# Patient Record
Sex: Male | Born: 1970 | Race: White | Hispanic: No | Marital: Married | Smoking: Current every day smoker
Health system: Southern US, Community
[De-identification: ages and names within clinical notes are randomized; demographics above are authoritative.]

---

## 2003-11-14 ENCOUNTER — Other Ambulatory Visit: Payer: Self-pay

## 2004-04-15 ENCOUNTER — Emergency Department: Payer: Self-pay | Admitting: Unknown Physician Specialty

## 2015-07-20 ENCOUNTER — Emergency Department: Payer: BLUE CROSS/BLUE SHIELD

## 2015-07-20 ENCOUNTER — Encounter: Payer: Self-pay | Admitting: *Deleted

## 2015-07-20 ENCOUNTER — Emergency Department
Admission: EM | Admit: 2015-07-20 | Discharge: 2015-07-20 | Disposition: A | Discharge status: Home / Self Care | Payer: BLUE CROSS/BLUE SHIELD | Attending: Emergency Medicine | Admitting: Emergency Medicine

## 2015-07-20 DIAGNOSIS — R531 Weakness: Secondary | ICD-10-CM | POA: Insufficient documentation

## 2015-07-20 DIAGNOSIS — F17210 Nicotine dependence, cigarettes, uncomplicated: Secondary | ICD-10-CM | POA: Diagnosis not present

## 2015-07-20 DIAGNOSIS — R42 Dizziness and giddiness: Secondary | ICD-10-CM

## 2015-07-20 DIAGNOSIS — R202 Paresthesia of skin: Secondary | ICD-10-CM | POA: Insufficient documentation

## 2015-07-20 LAB — CBC
HEMATOCRIT: 48.7 % (ref 40.0–52.0)
Hemoglobin: 16.4 g/dL (ref 13.0–18.0)
MCH: 29.6 pg (ref 26.0–34.0)
MCHC: 33.7 g/dL (ref 32.0–36.0)
MCV: 87.7 fL (ref 80.0–100.0)
Platelets: 269 10*3/uL (ref 150–440)
RBC: 5.55 MIL/uL (ref 4.40–5.90)
RDW: 13.4 % (ref 11.5–14.5)
WBC: 9.4 10*3/uL (ref 3.8–10.6)

## 2015-07-20 LAB — BASIC METABOLIC PANEL
ANION GAP: 6 (ref 5–15)
BUN: 12 mg/dL (ref 6–20)
CO2: 29 mmol/L (ref 22–32)
Calcium: 9.1 mg/dL (ref 8.9–10.3)
Chloride: 104 mmol/L (ref 101–111)
Creatinine, Ser: 1.09 mg/dL (ref 0.61–1.24)
GFR calc Af Amer: >60 mL/min (ref >60)
Glucose, Bld: 121 mg/dL — ABNORMAL HIGH (ref 65–99)
POTASSIUM: 3.8 mmol/L (ref 3.5–5.1)
SODIUM: 139 mmol/L (ref 135–145)

## 2015-07-20 LAB — TROPONIN I

## 2015-07-20 MED ORDER — ZOLPIDEM TARTRATE 10 MG PO TABS: 10.0000 mg | ORAL_TABLET | Freq: Every evening | ORAL | Status: DC | PRN

## 2015-07-20 NOTE — Discharge Instructions (Signed)

## 2015-07-20 NOTE — ED Notes (Signed)
Lab notified of add on troponin.

## 2015-07-20 NOTE — ED Notes (Signed)
Pt reports dizziness for 1 week with intermittent tingling of left side

## 2015-07-20 NOTE — ED Notes (Signed)
Patient taken to CT.

## 2015-07-20 NOTE — ED Provider Notes (Signed)
[facility] Emergency Department Provider Note     Time seen: ----------------------------------------- 6:25 PM on 07/20/2015 -----------------------------------------    I have reviewed the triage vital signs and the nursing notes.   HISTORY  Chief Complaint Dizziness    HPI Tanner Berg is a 45 y.o. male who presents ER for dizziness for the last week with intermittent tingling. Patient denies fevers or chills, states he feels lightheaded when he changes positions or when he stands up suddenly. Patient notes he's been under a lot of stress, he rarely sleeps, he works 2 jobs and drinks a pot of coffee in the morning.   History reviewed. No pertinent past medical history.  There are no active problems to display for this patient.   History reviewed. No pertinent past surgical history.  Allergies Review of patient's allergies indicates no known allergies.  Social History Social History  Substance Use Topics  . Smoking status: Current Every Day Smoker -- 1.00 packs/day    Types: Cigarettes  . Smokeless tobacco: None  . Alcohol Use: No    Review of Systems Constitutional: Negative for fever. Eyes: Negative for visual changes. ENT: Negative for sore throat. Cardiovascular: Negative for chest pain. Respiratory: Negative for shortness of breath. Gastrointestinal: Negative for abdominal pain, vomiting and diarrhea. Genitourinary: Negative for dysuria. Musculoskeletal: Negative for back pain. Skin: Negative for rash. Neurological: Negative for headaches, positive for weakness and dizziness  10-point ROS otherwise negative.  ____________________________________________   PHYSICAL EXAM:  VITAL SIGNS: ED Triage Vitals  Enc Vitals Group     BP 07/20/15 1642 148/77 mmHg     Pulse Rate 07/20/15 1642 78     Resp 07/20/15 1642 20     Temp 07/20/15 1642 97.9 F (36.6 C)     Temp src --      SpO2 07/20/15 1642 98 %     Weight 07/20/15 1642  200 lb (90.719 kg)     Height 07/20/15 1642  (1.803 m)     Head Cir --      Peak Flow --      Pain Score --      Pain Loc --      Pain Edu? --      Excl. in GC? --     Constitutional: Alert and oriented. Well appearing and in no distress. Eyes: Conjunctivae are normal. PERRL. Normal extraocular movements. ENT   Head: Normocephalic and atraumatic.   Nose: No congestion/rhinnorhea.   Mouth/Throat: Mucous membranes are moist.   Neck: No stridor. Cardiovascular: Normal rate, regular rhythm. Normal and symmetric distal pulses are present in all extremities. No murmurs, rubs, or gallops. Respiratory: Normal respiratory effort without tachypnea nor retractions. Breath sounds are clear and equal bilaterally. No wheezes/rales/rhonchi. Gastrointestinal: Soft and nontender. No distention. No abdominal bruits.  Musculoskeletal: Nontender with normal range of motion in all extremities. No joint effusions.  No lower extremity tenderness nor edema. Neurologic:  Normal speech and language. No gross focal neurologic deficits are appreciated. Speech is normal. No gait instability. Skin:  Skin is warm, dry and intact. No rash noted. Psychiatric: Mood and affect are normal. Speech and behavior are normal. Patient exhibits appropriate insight and judgment. ____________________________________________  EKG: Interpreted by me. Normal sinus rhythm rate of 78 bpm, normal PR interval, normal QRS, normal QT interval.  ____________________________________________  ED COURSE:  Pertinent labs & imaging results that were available during my care of the patient were reviewed by me and considered in my medical decision  making (see chart for details). Patient is in no acute distress, I will check basic labs and reevaluate. We will check orthostatics. ____________________________________________    LABS (pertinent positives/negatives)  Labs Reviewed  BASIC METABOLIC PANEL - Abnormal; Notable  for the following:    Glucose, Bld 121 (*)    All other components within normal limits  CBC  TROPONIN I  URINALYSIS COMPLETEWITH MICROSCOPIC ([facility] ONLY)    RADIOLOGY  CT head was unremarkable  ____________________________________________  FINAL ASSESSMENT AND PLAN  Dizziness  Plan: Patient with labs and imaging as dictated above. Patient symptoms are likely multifactorial including stress, sleep deprivation, and heavy caffeine and stimulant intake. I advised him to cut back on the caffeine, I'll prescribe Ambien for sleep and he has follow-up with his doctor on Monday.   Emily Filbert, MD   Emily Filbert, MD 07/20/15 [phone]

## 2015-07-20 NOTE — ED Notes (Signed)
Pt states he is unable to urinate at this time.

## 2016-05-16 ENCOUNTER — Emergency Department
Admission: EM | Admit: 2016-05-16 | Discharge: 2016-05-16 | Disposition: A | Discharge status: Home / Self Care | Payer: 59 | Attending: Emergency Medicine | Admitting: Emergency Medicine

## 2016-05-16 DIAGNOSIS — Y9389 Activity, other specified: Secondary | ICD-10-CM | POA: Insufficient documentation

## 2016-05-16 DIAGNOSIS — Y999 Unspecified external cause status: Secondary | ICD-10-CM | POA: Diagnosis not present

## 2016-05-16 DIAGNOSIS — S3093XA Unspecified superficial injury of penis, initial encounter: Secondary | ICD-10-CM | POA: Diagnosis present

## 2016-05-16 DIAGNOSIS — S301XXA Contusion of abdominal wall, initial encounter: Secondary | ICD-10-CM

## 2016-05-16 DIAGNOSIS — Z23 Encounter for immunization: Secondary | ICD-10-CM | POA: Insufficient documentation

## 2016-05-16 DIAGNOSIS — N4889 Other specified disorders of penis: Secondary | ICD-10-CM

## 2016-05-16 DIAGNOSIS — Y929 Unspecified place or not applicable: Secondary | ICD-10-CM | POA: Insufficient documentation

## 2016-05-16 DIAGNOSIS — W228XXA Striking against or struck by other objects, initial encounter: Secondary | ICD-10-CM | POA: Diagnosis not present

## 2016-05-16 DIAGNOSIS — S3021XA Contusion of penis, initial encounter: Secondary | ICD-10-CM | POA: Insufficient documentation

## 2016-05-16 DIAGNOSIS — F17210 Nicotine dependence, cigarettes, uncomplicated: Secondary | ICD-10-CM | POA: Insufficient documentation

## 2016-05-16 MED ORDER — TETANUS-DIPHTH-ACELL PERTUSSIS 5-2.5-18.5 LF-MCG/0.5 IM SUSP
0.5000 mL | Freq: Once | INTRAMUSCULAR | Status: AC
  Administered 2016-05-16: 0.5 mL via INTRAMUSCULAR
  Filled 2016-05-16: qty 0.5

## 2016-05-16 MED ORDER — CEPHALEXIN 500 MG PO CAPS: 500.0000 mg | ORAL_CAPSULE | Freq: Four times a day (QID) | ORAL | 0 refills | Status: DC

## 2016-05-16 MED ORDER — CEPHALEXIN 500 MG PO CAPS
500.0000 mg | ORAL_CAPSULE | Freq: Once | ORAL | Status: AC
  Administered 2016-05-16: 500 mg via ORAL
  Filled 2016-05-16: qty 1

## 2016-05-16 NOTE — ED Triage Notes (Signed)
Pt reports Tuesday he was pulling a tree limb and it came off and hit him in the groin. Pt c/o sweeling and redness to foreskin. Pt denies difficulty urinating, dysuria or hematuria.

## 2016-05-16 NOTE — Discharge Instructions (Signed)
Follow-up with urology. Take antibiotic as prescribed.  Get help right away if: You have a fever or chills. Pain or swelling or redness in the buttock or scrotum You have increased pain, swelling, or redness in your genitals or your groin area. Other new concerns arise

## 2016-05-16 NOTE — ED Provider Notes (Signed)
[facility] Emergency Department Provider Note   ____________________________________________   First MD Initiated Contact with Patient 05/16/16 [phone]     (approximate)  I have reviewed the triage vital signs and the nursing notes.   HISTORY  Chief Complaint Groin Pain   HPI Tanner Berg is a 45 y.o. male reports no significant medical history.  On Tuesday he was cutting down a tree, while pulling on a limits snapped and struck him across the groin. He reports that he had discomfort and slight swelling at the left base of the penis, it swelled up slightly over the last day and a half and he treated himself with a 12-gauge sterile needle and reports she is able with throbbing back several cc of blood. Swelling did not come back in that area, and he has not noticed any fevers chills or drainage but he has noticed a slight amount of swelling throughout the foreskin since yesterday. He is able to withdraw the foreskin and advance it without difficulty, denies it is painful, denies any discomfort in the penis or any ongoing or painful erections. He has not had any swelling or redness in the scrotum. He reports foreskin seems just slightly to moderately swollen throughout.  There was no bleeding or puncture of the skin when struck by the limb, but there was some slight swelling at the base and "bruising"   No past medical history on file.  There are no active problems to display for this patient.   No past surgical history on file.  Prior to Admission medications   Medication Sig Start Date End Date Taking? Authorizing Provider  cephALEXin (KEFLEX) 500 MG capsule Take 1 capsule (500 mg total) by mouth 4 (four) times daily. 05/16/16   Sharyn CreamerMark Yusef Lamp, MD  zolpidem (AMBIEN) 10 MG tablet Take 1 tablet (10 mg total) by mouth at bedtime as needed for sleep. 07/20/15   Emily FilbertJonathan E Williams, MD    Allergies Patient has no known allergies.  No family history on  file.  Social History Social History  Substance Use Topics  . Smoking status: Current Every Day Smoker    Packs/day: 1.00    Types: Cigarettes  . Smokeless tobacco: Not on file  . Alcohol use No    Review of Systems Constitutional: No fever/chills Eyes: No visual changes. ENT: No sore throat. Cardiovascular: Denies chest pain. Respiratory: Denies shortness of breath. Gastrointestinal: No abdominal pain.  No nausea, no vomiting.  No diarrhea.  No constipation. Genitourinary: Negative for dysuria. No drainage or pus Musculoskeletal: Negative for back pain. Skin: Negative for rash. Neurological: Negative for headaches, focal weakness or numbness.  10-point ROS otherwise negative.  ____________________________________________   PHYSICAL EXAM:  VITAL SIGNS: ED Triage Vitals  Enc Vitals Group     BP 05/16/16 0853 (!) 156/78     Pulse Rate 05/16/16 0853 (!) 118     Resp 05/16/16 0853 20     Temp 05/16/16 0853 98.6 F (37 C)     Temp Source 05/16/16 0853 Oral     SpO2 05/16/16 0853 100 %     Weight 05/16/16 0854 188 lb (85.3 kg)     Height 05/16/16 0854 5\' 11"  (1.803 m)     Head Circumference --      Peak Flow --      Pain Score 05/16/16 0854 3     Pain Loc --      Pain Edu? --      Excl. in GC? --  Constitutional: Alert and oriented. Well appearing and in no acute distress. Eyes: Conjunctivae are normal. PERRL. EOMI. Head: Atraumatic. Nose: No congestion/rhinnorhea. Mouth/Throat: Mucous membranes are moist.  Oropharynx non-erythematous. Neck: No stridor.   Cardiovascular: Normal rate, regular rhythm.  Respiratory: Normal respiratory effort.  Gastrointestinal: Soft and nontender.  Genitourinary: No groin pain or mass. The penis is flaccid, uncircumcised with a slight amount of global edema involving the foreskin, which is easily retractable and advance supple. The glands penis appears normal. At the superior aspect of the shaft, there is a small area of firmness  where the patient reports he drained blood from 2 days ago. There is no tenderness to the penis. No purulence or drainage. There is a slight amount of erythema involving the foreskin globally, but no focal points area of the scrotum is normal nontender with nontender testicles, no scrotal edema or redness. No redness along the perineum. Musculoskeletal: No lower extremity tenderness nor edema. Neurologic:  Normal speech and language. No gross focal neurologic deficits are appreciated. No gait instability. Skin:  Skin is warm, dry and intact. No rash noted. Psychiatric: Mood and affect are normal. Speech and behavior are normal.  ____________________________________________   LABS (all labs ordered are listed, but only abnormal results are displayed)  Labs Reviewed - No data to display ____________________________________________  EKG   ____________________________________________  RADIOLOGY   ____________________________________________   PROCEDURES  Procedure(s) performed: None  Procedures  Critical Care performed: No  ____________________________________________   INITIAL IMPRESSION / ASSESSMENT AND PLAN / ED COURSE  Pertinent labs & imaging results that were available during my care of the patient were reviewed by me and considered in my medical decision making (see chart for details).  Patient noted to have had a hematoma over the ventral base of the penis which she has drained, appears this is improving but has not developed mild edema with minimal erythema of the foreskin. No evidence of extension into the scrotum or gland/shaft of the penis itself. Discussed with Dr. Cleda MccreedyPatrick Mackenzie of urology, advises treatment with Keflex and close follow-up. Discussed with the patient careful return precautions and treatment recommendations. Patient will monitor for improvement, return to the ER if any concerns which I personally discussed and provided him and discharge  instructions  Clinical Course      ____________________________________________   FINAL CLINICAL IMPRESSION(S) / ED DIAGNOSES  Final diagnoses:  Hematoma of groin, initial encounter  Foreskin swelling      NEW MEDICATIONS STARTED DURING THIS VISIT:  New Prescriptions   CEPHALEXIN (KEFLEX) 500 MG CAPSULE    Take 1 capsule (500 mg total) by mouth 4 (four) times daily.     Note:  This document was prepared using Dragon voice recognition software and may include unintentional dictation errors.     Sharyn CreamerMark Kavion Mancinas, MD 05/16/16 [phone]

## 2017-10-05 ENCOUNTER — Encounter: Payer: Self-pay | Admitting: Emergency Medicine

## 2017-10-05 ENCOUNTER — Emergency Department
Admission: EM | Admit: 2017-10-05 | Discharge: 2017-10-06 | Disposition: A | Discharge status: Home / Self Care | Payer: BLUE CROSS/BLUE SHIELD | Attending: Emergency Medicine | Admitting: Emergency Medicine

## 2017-10-05 ENCOUNTER — Other Ambulatory Visit: Payer: Self-pay

## 2017-10-05 ENCOUNTER — Emergency Department: Payer: BLUE CROSS/BLUE SHIELD

## 2017-10-05 DIAGNOSIS — F17210 Nicotine dependence, cigarettes, uncomplicated: Secondary | ICD-10-CM | POA: Insufficient documentation

## 2017-10-05 DIAGNOSIS — R42 Dizziness and giddiness: Secondary | ICD-10-CM | POA: Diagnosis not present

## 2017-10-05 DIAGNOSIS — M542 Cervicalgia: Secondary | ICD-10-CM

## 2017-10-05 DIAGNOSIS — M4802 Spinal stenosis, cervical region: Secondary | ICD-10-CM | POA: Diagnosis not present

## 2017-10-05 DIAGNOSIS — M9981 Other biomechanical lesions of cervical region: Secondary | ICD-10-CM

## 2017-10-05 LAB — CBC
HCT: 47.2 % (ref 40.0–52.0)
Hemoglobin: 16.3 g/dL (ref 13.0–18.0)
MCH: 31 pg (ref 26.0–34.0)
MCHC: 34.5 g/dL (ref 32.0–36.0)
MCV: 89.9 fL (ref 80.0–100.0)
PLATELETS: 276 10*3/uL (ref 150–440)
RBC: 5.25 MIL/uL (ref 4.40–5.90)
RDW: 13.4 % (ref 11.5–14.5)
WBC: 10.9 10*3/uL — AB (ref 3.8–10.6)

## 2017-10-05 LAB — URINALYSIS, COMPLETE (UACMP) WITH MICROSCOPIC
Bacteria, UA: NONE SEEN
Bilirubin Urine: NEGATIVE
GLUCOSE, UA: NEGATIVE mg/dL
HGB URINE DIPSTICK: NEGATIVE
KETONES UR: NEGATIVE mg/dL
LEUKOCYTES UA: NEGATIVE
NITRITE: NEGATIVE
PH: 7 (ref 5.0–8.0)
PROTEIN: NEGATIVE mg/dL
Specific Gravity, Urine: 1.005 (ref 1.005–1.030)
Squamous Epithelial / LPF: NONE SEEN

## 2017-10-05 LAB — GLUCOSE, CAPILLARY: Glucose-Capillary: 148 mg/dL — ABNORMAL HIGH (ref 65–99)

## 2017-10-05 LAB — BASIC METABOLIC PANEL
Anion gap: 7 (ref 5–15)
BUN: 13 mg/dL (ref 6–20)
CALCIUM: 9 mg/dL (ref 8.9–10.3)
CHLORIDE: 104 mmol/L (ref 101–111)
CO2: 27 mmol/L (ref 22–32)
CREATININE: 1.04 mg/dL (ref 0.61–1.24)
Glucose, Bld: 146 mg/dL — ABNORMAL HIGH (ref 65–99)
Potassium: 3.9 mmol/L (ref 3.5–5.1)
SODIUM: 138 mmol/L (ref 135–145)

## 2017-10-05 LAB — TROPONIN I: Troponin I: <0.03 ng/mL (ref <0.03)

## 2017-10-05 IMAGING — CT CT ANGIO NECK
2 of 11 series · 7 of 33 positions shown · IV contrast (APPLIED)
Comparison: CT HEAD [DATE]

CLINICAL DATA: Intermittent dizziness, nausea and head numbness.
Assess episodic vertigo. Smoker.

EXAM:
CT ANGIOGRAPHY HEAD AND NECK
TECHNIQUE: Multidetector CT imaging of the head and neck was performed using
the standard protocol during bolus administration of intravenous
contrast. Multiplanar CT image reconstructions and MIPs were
obtained to evaluate the vascular anatomy. Carotid stenosis
measurements (when applicable) are obtained utilizing NASCET
criteria, using the distal internal carotid diameter as the
denominator.
CONTRAST:  75mL OMNIPAQUE IOHEXOL 350 MG/ML SOLN

[Series 10: cta head neck · axial · 0.54mm/px · z∈[+790,+920]mm · 2 of 197 slices shown]
[im 66/197  soft-tissue]
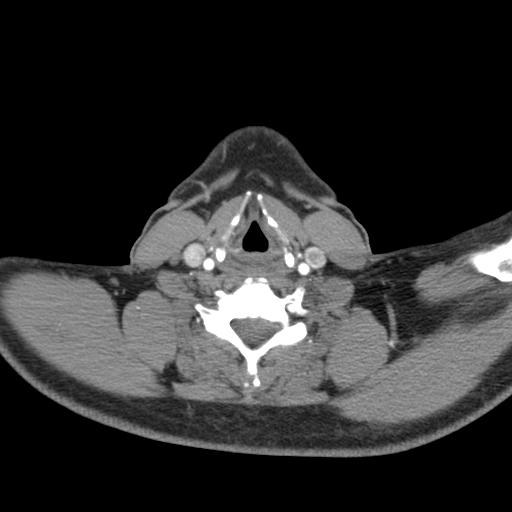
[im 131/197  soft-tissue]
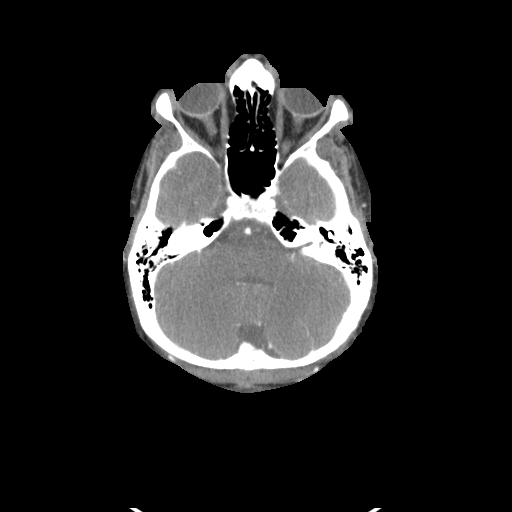

[Series 12: ax thin · axial · 0.43mm/px · z∈[+723,+985]mm · 5 of 394 slices shown]
[im 66/394  soft-tissue]
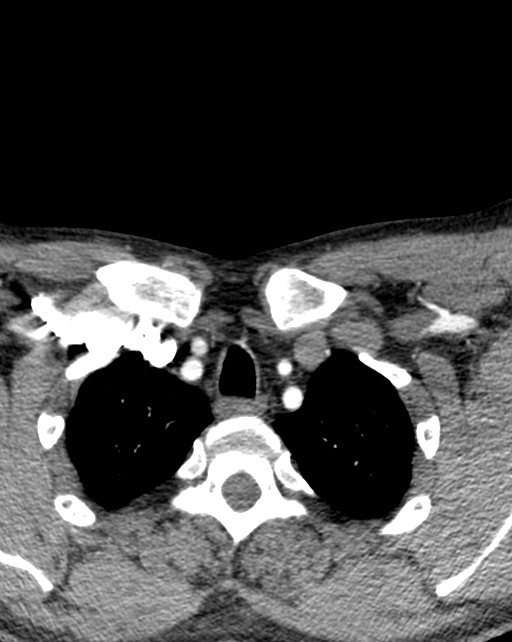
[im 132/394  bone]
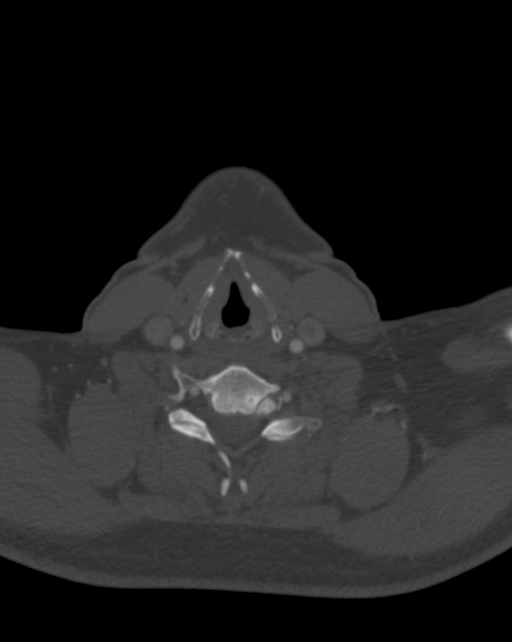
[im 197/394  soft-tissue]
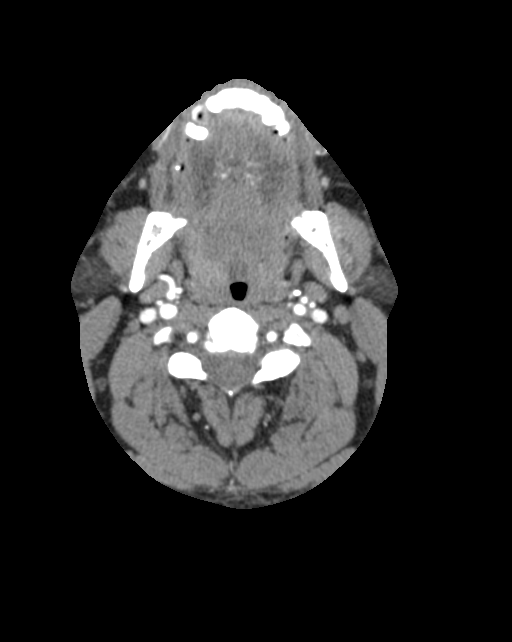
[im 263/394  bone]
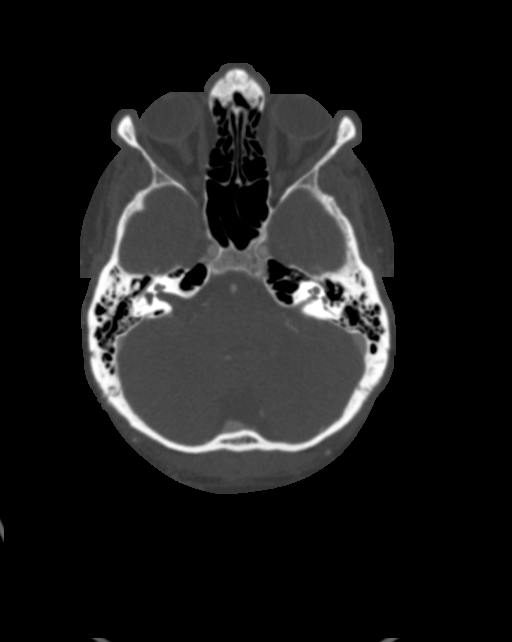
[im 328/394  soft-tissue]
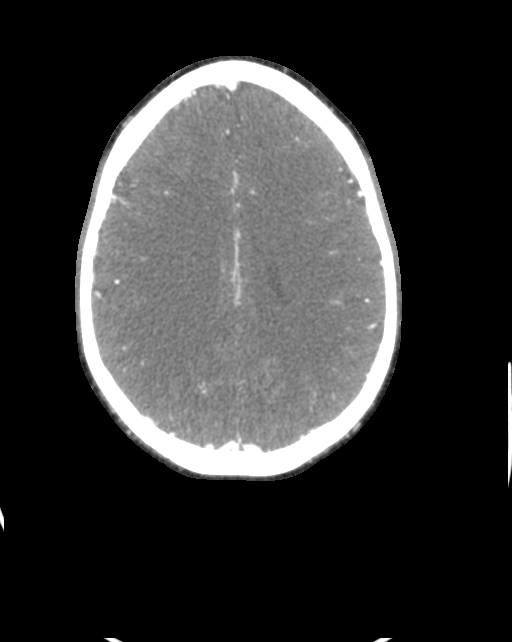

[7 of 33 positions shown; findings below may reference images not displayed]

FINDINGS: CT HEAD FINDINGS

BRAIN: No intraparenchymal hemorrhage, mass effect nor midline
shift. The ventricles and sulci are normal. No acute large vascular
territory infarcts. Mild beam hardening artifact through temporal
lobes. No abnormal extra-axial fluid collections. Basal cisterns are
patent.

VASCULAR: Unremarkable.

SKULL/SOFT TISSUES: No skull fracture. No significant soft tissue
swelling.

ORBITS/SINUSES: The included ocular globes and orbital contents are
normal.Trace maxillary sinus mucosal thickening without air-fluid
levels. Mastoid air cells are well aerated.

OTHER: None.

CTA NECK FINDINGS:

AORTIC ARCH: Normal appearance of the thoracic arch, normal branch
pattern. The origins of the innominate, left Common carotid artery
and subclavian artery are widely patent.

RIGHT CAROTID SYSTEM: Common carotid artery is widely patent,
coursing in a straight line fashion. Normal appearance of the
carotid bifurcation without hemodynamically significant stenosis by
NASCET criteria. Normal appearance of the internal carotid artery.

LEFT CAROTID SYSTEM: Common carotid artery is widely patent,
coursing in a straight line fashion. Trace calcific atherosclerosis
carotid bifurcation without hemodynamically significant stenosis by
NASCET criteria. Normal appearance of the internal carotid artery.

VERTEBRAL ARTERIES:Codominant vertebral arteries. Normal appearance
of the vertebral arteries, widely patent.

SKELETON: No acute osseous process though bone windows have not been
submitted. Poor dentition. Moderate lateral dental osteoarthrosis.
Mild cervical spondylosis. Moderate LEFT upper cervical facet
arthropathy. Severe LEFT C3-4 neural foraminal narrowing. Moderate
LEFT C6-7 neural foraminal narrowing.

OTHER NECK: Soft tissues of the neck are nonacute though, not
tailored for evaluation.

UPPER CHEST: Numerous tiny centrilobular ground-glass nodules
compatible respiratory bronchiolitis. No superior mediastinal
lymphadenopathy.

CTA HEAD FINDINGS:

ANTERIOR CIRCULATION: Patent cervical internal carotid arteries,
petrous, cavernous and supra clinoid internal carotid arteries.
Patent anterior communicating artery. Patent anterior and middle
cerebral arteries.

No large vessel occlusion, significant stenosis, contrast
extravasation or aneurysm.

POSTERIOR CIRCULATION: Patent vertebral arteries, vertebrobasilar
junction and basilar artery, as well as main branch vessels. Patent
posterior cerebral arteries.

No large vessel occlusion, significant stenosis, contrast
extravasation or aneurysm.

VENOUS SINUSES: Major dural venous sinuses are patent though not
tailored for evaluation on this angiographic examination.

ANATOMIC VARIANTS: None.

DELAYED PHASE: No abnormal intracranial enhancement.

MIP images reviewed.
IMPRESSION: CT HEAD:

1. Normal noncontrast CT HEAD with and without contrast.

CTA NECK:

1. No hemodynamically significant stenosis or acute vascular
process.
2. Severe LEFT C3-4 neural foraminal narrowing.

CTA HEAD:

1. Normal CTA HEAD.

## 2017-10-05 MED ORDER — IOHEXOL 350 MG/ML SOLN
75.0000 mL | Freq: Once | INTRAVENOUS | Status: AC | PRN
  Administered 2017-10-05: 75 mL via INTRAVENOUS

## 2017-10-05 MED ORDER — LIDOCAINE 5 % EX PTCH
1.0000 | MEDICATED_PATCH | CUTANEOUS | Status: DC
  Administered 2017-10-05: 1 via TRANSDERMAL
  Filled 2017-10-05: qty 1

## 2017-10-05 MED ORDER — KETOROLAC TROMETHAMINE 30 MG/ML IJ SOLN
30.0000 mg | Freq: Once | INTRAMUSCULAR | Status: AC
  Administered 2017-10-05: 30 mg via INTRAVENOUS
  Filled 2017-10-05: qty 1

## 2017-10-05 NOTE — ED Triage Notes (Signed)
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

## 2017-10-05 NOTE — ED Provider Notes (Signed)
[facility] Emergency Department Provider Note   ____________________________________________   First MD Initiated Contact with Patient 10/05/17 2303     (approximate)  I have reviewed the triage vital signs and the nursing notes.   HISTORY  Chief Complaint Dizziness    HPI Bishop DublinSteven Munyan is a 47 y.o. male who comes into the hospital today with some dizziness.  The patient states that he got up from sleeping and had a lot of pain in his neck.  He states that he has been having chronic pain but he started feeling lightheaded when he was sitting on the couch.  He states that normally the dizziness goes away as this the pain but it did not today.  He then started having some numbness in his arms bilaterally so he decided to come to get checked out.  He reports that he was feeling very dizzy and loopy since about 5 PM and his neck hurts to move.  The patient states that he received some muscle relaxers from his primary care physician but he does not take them especially when he has to go to work.  The patient was concerned that he would not be able to make it into work tonight.  He also states that he may have slept wrong.  He has been drinking water but does not sleep well.  He states that he has been working night shifts.  The patient reports that this is been going on for a few months but it was worse today so he decided to come and get checked out.  He denies any chest pain or shortness of breath.  The patient rates his current pain a 3-4 out of 10 in intensity.  History reviewed. No pertinent past medical history.  There are no active problems to display for this patient.   History reviewed. No pertinent surgical history.  Prior to Admission medications   Medication Sig Start Date End Date Taking? Authorizing Provider  cephALEXin (KEFLEX) 500 MG capsule Take 1 capsule (500 mg total) by mouth 4 (four) times daily. 05/16/16   Sharyn CreamerQuale, Mark, MD  etodolac (LODINE) 200  MG capsule Take 1 capsule (200 mg total) by mouth every 8 (eight) hours. 10/06/17   Rebecka ApleyWebster, Kazumi Lachney P, MD  lidocaine (LIDODERM) 5 % Place 1 patch onto the skin every 12 (twelve) hours. Remove & Discard patch within 12 hours or as directed by MD 10/06/17 10/06/18  Rebecka ApleyWebster, Uriel Dowding P, MD  zolpidem (AMBIEN) 10 MG tablet Take 1 tablet (10 mg total) by mouth at bedtime as needed for sleep. 07/20/15   Emily FilbertWilliams, Jonathan E, MD    Allergies Patient has no known allergies.  No family history on file.  Social History Social History   Tobacco Use  . Smoking status: Current Every Day Smoker    Packs/day: 1.00    Types: Cigarettes  . Smokeless tobacco: Never Used  Substance Use Topics  . Alcohol use: No  . Drug use: Not on file    Review of Systems  Constitutional: No fever/chills Eyes: No visual changes. ENT: No sore throat. Cardiovascular: Denies chest pain. Respiratory: Denies shortness of breath. Gastrointestinal: No abdominal pain.  No nausea, no vomiting.  No diarrhea.  No constipation. Genitourinary: Negative for dysuria. Musculoskeletal: Neck pain Skin: Negative for rash. Neurological: Dizziness, arm numbness   ____________________________________________   PHYSICAL EXAM:  VITAL SIGNS: ED Triage Vitals  Enc Vitals Group     BP 10/05/17 2024 (!) 148/84  Pulse Rate 10/05/17 2024 79     Resp 10/05/17 2024 18     Temp 10/05/17 2024 98 F (36.7 C)     Temp Source 10/05/17 2024 Oral     SpO2 10/05/17 2024 99 %     Weight 10/05/17 2026 196 lb (88.9 kg)     Height 10/05/17 2026 5\' 11"  (1.803 m)     Head Circumference --      Peak Flow --      Pain Score 10/05/17 2024 4     Pain Loc --      Pain Edu? --      Excl. in GC? --     Constitutional: Alert and oriented. Well appearing and in mild to moderate distress. Eyes: Conjunctivae are normal. PERRL. EOMI. Head: Atraumatic. Nose: No congestion/rhinnorhea. Mouth/Throat: Mucous membranes are moist.  Oropharynx  non-erythematous. Cardiovascular: Normal rate, regular rhythm. Grossly normal heart sounds.  Good peripheral circulation. Respiratory: Normal respiratory effort.  No retractions. Lungs CTAB. Gastrointestinal: Soft and nontender. No distention.  Positive bowel sounds Musculoskeletal: No lower extremity tenderness nor edema.  Tenderness to palpation at the patient's lower cervical spine with some mild discomfort to lateral rotation as well as flexion and extension. Neurologic:  Normal speech and language.  Cranial nerves II through XII are grossly intact with no focal motor or neuro deficit Skin:  Skin is warm, dry and intact.  Psychiatric: Mood and affect are normal.   ____________________________________________   LABS (all labs ordered are listed, but only abnormal results are displayed)  Labs Reviewed  BASIC METABOLIC PANEL - Abnormal; Notable for the following components:      Result Value   Glucose, Bld 146 (*)    All other components within normal limits  CBC - Abnormal; Notable for the following components:   WBC 10.9 (*)    All other components within normal limits  URINALYSIS, COMPLETE (UACMP) WITH MICROSCOPIC - Abnormal; Notable for the following components:   Color, Urine STRAW (*)    APPearance CLEAR (*)    All other components within normal limits  GLUCOSE, CAPILLARY - Abnormal; Notable for the following components:   Glucose-Capillary 148 (*)    All other components within normal limits  TROPONIN I  CBG MONITORING, ED   ____________________________________________  EKG  ED ECG REPORT I, Rebecka Apley, the attending physician, personally viewed and interpreted this ECG.   Date: 10/05/2017  EKG Time: 2028  Rate: 85  Rhythm: normal sinus rhythm  Axis: normal  Intervals:none  ST&T Change: none  ____________________________________________  RADIOLOGY  ED MD interpretation:  CT angio head and neck: Normal noncontrast CT head with and without contrast,  No hemodynamically significant stenosis or acute vascular process, severe Left C3-4 neural foraminal narrowing, Normal CTA Head  Official radiology report(s): Ct Angio Head W Or Wo Contrast  Result Date: 10/06/2017 CLINICAL DATA:  Intermittent dizziness, nausea and head numbness. Assess episodic vertigo. Smoker. EXAM: CT ANGIOGRAPHY HEAD AND NECK TECHNIQUE: Multidetector CT imaging of the head and neck was performed using the standard protocol during bolus administration of intravenous contrast. Multiplanar CT image reconstructions and MIPs were obtained to evaluate the vascular anatomy. Carotid stenosis measurements (when applicable) are obtained utilizing NASCET criteria, using the distal internal carotid diameter as the denominator. CONTRAST:  75mL OMNIPAQUE IOHEXOL 350 MG/ML SOLN COMPARISON:  CT HEAD July 20, 2015 FINDINGS: CT HEAD FINDINGS BRAIN: No intraparenchymal hemorrhage, mass effect nor midline shift. The ventricles and sulci are normal. No acute  large vascular territory infarcts. Mild beam hardening artifact through temporal lobes. No abnormal extra-axial fluid collections. Basal cisterns are patent. VASCULAR: Unremarkable. SKULL/SOFT TISSUES: No skull fracture. No significant soft tissue swelling. ORBITS/SINUSES: The included ocular globes and orbital contents are normal.Trace maxillary sinus mucosal thickening without air-fluid levels. Mastoid air cells are well aerated. OTHER: None. CTA NECK FINDINGS: AORTIC ARCH: Normal appearance of the thoracic arch, normal branch pattern. The origins of the innominate, left Common carotid artery and subclavian artery are widely patent. RIGHT CAROTID SYSTEM: Common carotid artery is widely patent, coursing in a straight line fashion. Normal appearance of the carotid bifurcation without hemodynamically significant stenosis by NASCET criteria. Normal appearance of the internal carotid artery. LEFT CAROTID SYSTEM: Common carotid artery is widely patent,  coursing in a straight line fashion. Trace calcific atherosclerosis carotid bifurcation without hemodynamically significant stenosis by NASCET criteria. Normal appearance of the internal carotid artery. VERTEBRAL ARTERIES:Codominant vertebral arteries. Normal appearance of the vertebral arteries, widely patent. SKELETON: No acute osseous process though bone windows have not been submitted. Poor dentition. Moderate lateral dental osteoarthrosis. Mild cervical spondylosis. Moderate LEFT upper cervical facet arthropathy. Severe LEFT C3-4 neural foraminal narrowing. Moderate LEFT C6-7 neural foraminal narrowing. OTHER NECK: Soft tissues of the neck are nonacute though, not tailored for evaluation. UPPER CHEST: Numerous tiny centrilobular ground-glass nodules compatible respiratory bronchiolitis. No superior mediastinal lymphadenopathy. CTA HEAD FINDINGS: ANTERIOR CIRCULATION: Patent cervical internal carotid arteries, petrous, cavernous and supra clinoid internal carotid arteries. Patent anterior communicating artery. Patent anterior and middle cerebral arteries. No large vessel occlusion, significant stenosis, contrast extravasation or aneurysm. POSTERIOR CIRCULATION: Patent vertebral arteries, vertebrobasilar junction and basilar artery, as well as main branch vessels. Patent posterior cerebral arteries. No large vessel occlusion, significant stenosis, contrast extravasation or aneurysm. VENOUS SINUSES: Major dural venous sinuses are patent though not tailored for evaluation on this angiographic examination. ANATOMIC VARIANTS: None. DELAYED PHASE: No abnormal intracranial enhancement. MIP images reviewed. IMPRESSION: CT HEAD: 1. Normal noncontrast CT HEAD with and without contrast. CTA NECK: 1. No hemodynamically significant stenosis or acute vascular process. 2. Severe LEFT C3-4 neural foraminal narrowing. CTA HEAD: 1. Normal CTA HEAD. Electronically Signed   By: Awilda Metro M.D.   On: 10/06/2017 00:37   Ct  Angio Neck W And/or Wo Contrast  Result Date: 10/06/2017 CLINICAL DATA:  Intermittent dizziness, nausea and head numbness. Assess episodic vertigo. Smoker. EXAM: CT ANGIOGRAPHY HEAD AND NECK TECHNIQUE: Multidetector CT imaging of the head and neck was performed using the standard protocol during bolus administration of intravenous contrast. Multiplanar CT image reconstructions and MIPs were obtained to evaluate the vascular anatomy. Carotid stenosis measurements (when applicable) are obtained utilizing NASCET criteria, using the distal internal carotid diameter as the denominator. CONTRAST:  75mL OMNIPAQUE IOHEXOL 350 MG/ML SOLN COMPARISON:  CT HEAD July 20, 2015 FINDINGS: CT HEAD FINDINGS BRAIN: No intraparenchymal hemorrhage, mass effect nor midline shift. The ventricles and sulci are normal. No acute large vascular territory infarcts. Mild beam hardening artifact through temporal lobes. No abnormal extra-axial fluid collections. Basal cisterns are patent. VASCULAR: Unremarkable. SKULL/SOFT TISSUES: No skull fracture. No significant soft tissue swelling. ORBITS/SINUSES: The included ocular globes and orbital contents are normal.Trace maxillary sinus mucosal thickening without air-fluid levels. Mastoid air cells are well aerated. OTHER: None. CTA NECK FINDINGS: AORTIC ARCH: Normal appearance of the thoracic arch, normal branch pattern. The origins of the innominate, left Common carotid artery and subclavian artery are widely patent. RIGHT CAROTID SYSTEM: Common carotid artery is widely patent, coursing  in a straight line fashion. Normal appearance of the carotid bifurcation without hemodynamically significant stenosis by NASCET criteria. Normal appearance of the internal carotid artery. LEFT CAROTID SYSTEM: Common carotid artery is widely patent, coursing in a straight line fashion. Trace calcific atherosclerosis carotid bifurcation without hemodynamically significant stenosis by NASCET criteria. Normal  appearance of the internal carotid artery. VERTEBRAL ARTERIES:Codominant vertebral arteries. Normal appearance of the vertebral arteries, widely patent. SKELETON: No acute osseous process though bone windows have not been submitted. Poor dentition. Moderate lateral dental osteoarthrosis. Mild cervical spondylosis. Moderate LEFT upper cervical facet arthropathy. Severe LEFT C3-4 neural foraminal narrowing. Moderate LEFT C6-7 neural foraminal narrowing. OTHER NECK: Soft tissues of the neck are nonacute though, not tailored for evaluation. UPPER CHEST: Numerous tiny centrilobular ground-glass nodules compatible respiratory bronchiolitis. No superior mediastinal lymphadenopathy. CTA HEAD FINDINGS: ANTERIOR CIRCULATION: Patent cervical internal carotid arteries, petrous, cavernous and supra clinoid internal carotid arteries. Patent anterior communicating artery. Patent anterior and middle cerebral arteries. No large vessel occlusion, significant stenosis, contrast extravasation or aneurysm. POSTERIOR CIRCULATION: Patent vertebral arteries, vertebrobasilar junction and basilar artery, as well as main branch vessels. Patent posterior cerebral arteries. No large vessel occlusion, significant stenosis, contrast extravasation or aneurysm. VENOUS SINUSES: Major dural venous sinuses are patent though not tailored for evaluation on this angiographic examination. ANATOMIC VARIANTS: None. DELAYED PHASE: No abnormal intracranial enhancement. MIP images reviewed. IMPRESSION: CT HEAD: 1. Normal noncontrast CT HEAD with and without contrast. CTA NECK: 1. No hemodynamically significant stenosis or acute vascular process. 2. Severe LEFT C3-4 neural foraminal narrowing. CTA HEAD: 1. Normal CTA HEAD. Electronically Signed   By: Awilda Metro M.D.   On: 10/06/2017 00:37    ____________________________________________   PROCEDURES  Procedure(s) performed: None  Procedures  Critical Care performed:  No  ____________________________________________   INITIAL IMPRESSION / ASSESSMENT AND PLAN / ED COURSE  As part of my medical decision making, I reviewed the following data within the electronic MEDICAL RECORD NUMBER Notes from prior ED visits and [place] Controlled Substance Database   This is a 47 year old male who comes into the hospital today with some neck pain and dizziness.  The patient's symptoms are exacerbated whenever he flexes and extends his neck.  My differential diagnosis includes degenerative disc disease, vertebral artery dissection, vertebrobasilar insufficiency, nerve impingement.  The patient did have some blood work to include a CBC BMP glucose and a troponin which were unremarkable.  The patient's urinalysis is also unremarkable.  I will send the patient for a CTA of his head and neck and he will be reassessed.  The patient will receive some Toradol and a Lidoderm patch to his neck.  The patient's dizziness is improved.  He does have some severe neuroforaminal narrowing. He will need to follow up with orthopedic surgery for further evaluation of his symptoms.      ____________________________________________   FINAL CLINICAL IMPRESSION(S) / ED DIAGNOSES  Final diagnoses:  Dizziness  Neck pain  Neural foraminal stenosis of cervical spine     ED Discharge Orders        Ordered    lidocaine (LIDODERM) 5 %  Every 12 hours     10/06/17 0124    etodolac (LODINE) 200 MG capsule  Every 8 hours     10/06/17 0124       Note:  This document was prepared using Dragon voice recognition software and may include unintentional dictation errors.    Rebecka Apley, MD 10/06/17 [phone]

## 2017-10-06 MED ORDER — LIDOCAINE 5 % EX PTCH: 1.0000 | MEDICATED_PATCH | Freq: Two times a day (BID) | CUTANEOUS | 0 refills | Status: AC

## 2017-10-06 MED ORDER — ETODOLAC 200 MG PO CAPS: 200.0000 mg | ORAL_CAPSULE | Freq: Three times a day (TID) | ORAL | 0 refills | Status: DC

## 2017-10-06 NOTE — ED Notes (Signed)
Patient transported to CT 

## 2019-03-17 ENCOUNTER — Ambulatory Visit: Payer: Self-pay

## 2019-03-17 ENCOUNTER — Other Ambulatory Visit: Payer: Self-pay

## 2019-03-17 VITALS — BP 128/68 | HR 87 | Resp 17 | Ht 71.0 in | Wt 194.0 lb

## 2019-03-17 DIAGNOSIS — Z008 Encounter for other general examination: Secondary | ICD-10-CM

## 2019-03-17 DIAGNOSIS — Z23 Encounter for immunization: Secondary | ICD-10-CM

## 2019-03-17 LAB — POCT LIPID PANEL
HDL: 37
LDL: 100
Non-HDL: 136
POC Glucose: 133 mg/dl — AB (ref 70–99)
TC/HDL: 4.7
TC: 173
TRG: 178

## 2019-03-17 NOTE — Patient Instructions (Signed)
Preventing Influenza, Adult Influenza, more commonly known as "the flu," is a viral infection that mainly affects the respiratory tract. The respiratory tract includes structures that help you breathe, such as the lungs, nose, and throat. The flu causes many common cold symptoms, as well as a high fever and body aches. The flu spreads easily from person to person (is contagious). The flu is most common from December through March. This is called flu season.You can catch the flu virus by:  Breathing in droplets from an infected person's cough or sneeze.  Touching something that was recently contaminated with the virus and then touching your mouth, nose, or eyes. What can I do to lower my risk?        You can decrease your risk of getting the flu by:  Getting a flu shot (influenza vaccination) every year. This is the best way to prevent the flu. A flu shot is recommended for everyone age 6 months and older. ? It is best to get a flu shot in the fall, as soon as it is available. Getting a flu shot during winter or spring instead is still a good idea. Flu season can last into early spring. ? Preventing the flu through vaccination requires getting a new flu shot every year. This is because the flu virus changes slightly (mutates) from one year to the next. Even if a flu shot does not completely protect you from all flu virus mutations, it can reduce the severity of your illness and prevent dangerous complications of the flu. ? If you are pregnant, you can and should get a flu shot. ? If you have had a reaction to the shot in the past or if you are allergic to eggs, check with your health care provider before getting a flu shot. ? Sometimes the vaccine is available as a nasal spray. In some years, the nasal spray has not been as effective against the flu virus. Check with your health care provider if you have questions about this.  Practicing good health habits. This is especially important during  flu season. ? Avoid contact with people who are sick with flu or cold symptoms. ? Wash your hands with soap and water often. If soap and water are not available, use alcohol-based hand sanitizer. ? Avoid touching your hands to your face, especially when you have not washed your hands recently. ? Use a disinfectant to clean surfaces at home and at work that may be contaminated with the flu virus. ? Keep your body's disease-fighting system (immune system) in good shape by eating a healthy diet, drinking plenty of fluids, getting enough sleep, and exercising regularly. If you do get the flu, avoid spreading it to others by:  Staying home until your symptoms have been gone for at least one day.  Covering your mouth and nose when you cough or sneeze.  Avoiding close contact with others, especially babies and elderly people. Why are these changes important? Getting a flu shot and practicing good health habits protects you as well as other people. If you get the flu, your friends, family, and co-workers are also at risk of getting it, because it spreads so easily to others. Each year, about 2 out of every 10 people get the flu. Having the flu can lead to complications, such as pneumonia, ear infection, and sinus infection. The flu also can be deadly, especially for babies, people older than age 65, and people who have serious long-term diseases. How is this treated? Most   people recover from the flu by resting at home and drinking plenty of fluids. However, a prescription antiviral medicine may reduce your flu symptoms and may make your flu go away sooner. This medicine must be started within a few days of getting flu symptoms. You can talk with your health care provider about whether you need an antiviral medicine. Antiviral medicine may be prescribed for people who are at risk for more serious flu symptoms. This includes people who:  Are older than age 65.  Are pregnant.  Have a condition that  makes the flu worse or more dangerous. Where to find more information  Centers for Disease Control and Prevention: www.cdc.gov/flu/index.htm  Flu.gov: www.flu.gov/prevention-vaccination  American Academy of Family Physicians: familydoctor.org/familydoctor/en/kids/vaccines/preventing-the-flu.html Contact a health care provider if:  You have influenza and you develop new symptoms.  You have: ? Chest pain. ? Diarrhea. ? A fever.  Your cough gets worse, or you produce more mucus. Summary  The best way to prevent the flu is to get a flu shot every year in the fall.  Even if you get the flu after you have received the yearly vaccine, your flu may be milder and go away sooner because of your flu shot.  If you get the flu, antiviral medicines that are started with a few days of symptoms may reduce your flu symptoms and may make your flu go away sooner.  You can also help prevent the flu by practicing good health habits. This information is not intended to replace advice given to you by your health care provider. Make sure you discuss any questions you have with your health care provider. Document Released: 06/24/2015 Document Revised: 05/22/2017 Document Reviewed: 02/16/2016 Elsevier Patient Education  2020 Elsevier Inc. Influenza (Flu) Vaccine (Inactivated or Recombinant): What You Need to Know 1. Why get vaccinated? Influenza vaccine can prevent influenza (flu). Flu is a contagious disease that spreads around the [place] every year, usually between October and May. Anyone can get the flu, but it is more dangerous for some people. Infants and young children, people 65 years of age and older, pregnant women, and people with certain health conditions or a weakened immune system are at greatest risk of flu complications. Pneumonia, bronchitis, sinus infections and ear infections are examples of flu-related complications. If you have a medical condition, such as heart disease, cancer or  diabetes, flu can make it worse. Flu can cause fever and chills, sore throat, muscle aches, fatigue, cough, headache, and runny or stuffy nose. Some people may have vomiting and diarrhea, though this is more common in children than adults. Each year thousands of people in the [place] die from flu, and many more are hospitalized. Flu vaccine prevents millions of illnesses and flu-related visits to the doctor each year. 2. Influenza vaccine CDC recommends everyone 6 months of age and older get vaccinated every flu season. Children 6 months through 8 years of age may need 2 doses during a single flu season. Everyone else needs only 1 dose each flu season. It takes about 2 weeks for protection to develop after vaccination. There are many flu viruses, and they are always changing. Each year a new flu vaccine is made to protect against three or four viruses that are likely to cause disease in the upcoming flu season. Even when the vaccine doesn't exactly match these viruses, it may still provide some protection. Influenza vaccine does not cause flu. Influenza vaccine may be given at the same time as other vaccines. 3.   Talk with your health care provider Tell your vaccine provider if the person getting the vaccine:  Has had an allergic reaction after a previous dose of influenza vaccine, or has any severe, life-threatening allergies.  Has ever had Guillain-Barr Syndrome (also called GBS). In some cases, your health care provider may decide to postpone influenza vaccination to a future visit. People with minor illnesses, such as a cold, may be vaccinated. People who are moderately or severely ill should usually wait until they recover before getting influenza vaccine. Your health care provider can give you more information. 4. Risks of a vaccine reaction  Soreness, redness, and swelling where shot is given, fever, muscle aches, and headache can happen after influenza vaccine.  There may be a  very small increased risk of Guillain-Barr Syndrome (GBS) after inactivated influenza vaccine (the flu shot). Young children who get the flu shot along with pneumococcal vaccine (PCV13), and/or DTaP vaccine at the same time might be slightly more likely to have a seizure caused by fever. Tell your health care provider if a child who is getting flu vaccine has ever had a seizure. People sometimes faint after medical procedures, including vaccination. Tell your provider if you feel dizzy or have vision changes or ringing in the ears. As with any medicine, there is a very remote chance of a vaccine causing a severe allergic reaction, other serious injury, or death. 5. What if there is a serious problem? An allergic reaction could occur after the vaccinated person leaves the clinic. If you see signs of a severe allergic reaction (hives, swelling of the face and throat, difficulty breathing, a fast heartbeat, dizziness, or weakness), call 9-1-1 and get the person to the nearest hospital. For other signs that concern you, call your health care provider. Adverse reactions should be reported to the Vaccine Adverse Event Reporting System (VAERS). Your health care provider will usually file this report, or you can do it yourself. Visit the VAERS website at www.vaers.hhs.gov or call 1-800-822-7967.VAERS is only for reporting reactions, and VAERS staff do not give medical advice. 6. The National Vaccine Injury Compensation Program The National Vaccine Injury Compensation Program (VICP) is a federal program that was created to compensate people who may have been injured by certain vaccines. Visit the VICP website at www.hrsa.gov/vaccinecompensation or call 1-800-338-2382 to learn about the program and about filing a claim. There is a time limit to file a claim for compensation. 7. How can I learn more?  Ask your healthcare provider.  Call your local or state health department.  Contact the Centers for Disease  Control and Prevention (CDC): ? Call 1-800-232-4636 (1-800-CDC-INFO) or ? Visit CDC's www.cdc.gov/flu Vaccine Information Statement (Interim) Inactivated Influenza Vaccine (02/04/2018) This information is not intended to replace advice given to you by your health care provider. Make sure you discuss any questions you have with your health care provider. Document Released: 04/03/2006 Document Revised: 09/28/2018 Document Reviewed: 02/08/2018 Elsevier Patient Education  2020 Elsevier Inc.  

## 2019-03-17 NOTE — Progress Notes (Signed)
     Patient ID: Tanner Berg, male    DOB: 09-Jan-1971, 48 y.o.   MRN: [id number]    Thank you!!  Apolonio Schneiders RN  [place] [facility] [place]: [phone]  Cell:  [phone] Website: [email or web address]

## 2019-04-25 ENCOUNTER — Other Ambulatory Visit: Payer: Self-pay

## 2019-04-25 DIAGNOSIS — Z20822 Contact with and (suspected) exposure to covid-19: Secondary | ICD-10-CM

## 2019-04-26 LAB — NOVEL CORONAVIRUS, NAA: SARS-CoV-2, NAA: NOT DETECTED

## 2019-04-27 ENCOUNTER — Telehealth: Payer: Self-pay

## 2019-04-27 NOTE — Telephone Encounter (Signed)
Updated with negative results at this time.

## 2019-04-27 NOTE — Telephone Encounter (Signed)
Left message to return call to update with COVID results

## 2019-08-08 ENCOUNTER — Other Ambulatory Visit: Payer: BLUE CROSS/BLUE SHIELD

## 2019-08-09 ENCOUNTER — Ambulatory Visit: Payer: Self-pay | Attending: Internal Medicine

## 2019-08-09 DIAGNOSIS — U071 COVID-19: Secondary | ICD-10-CM | POA: Insufficient documentation

## 2019-08-09 DIAGNOSIS — Z20822 Contact with and (suspected) exposure to covid-19: Secondary | ICD-10-CM

## 2019-08-10 LAB — NOVEL CORONAVIRUS, NAA: SARS-CoV-2, NAA: DETECTED — AB

## 2019-08-10 NOTE — Progress Notes (Signed)
Your test for COVID-19 was positive ("detected"), meaning that you were infected with the novel coronavirus and could give the germ to others.    Please continue isolation at home, for at least 10 days since the start of your fever/cough/breathlessness and until you have had 24 hours without fever (without taking a fever reducer) and with any cough/breathlessness improving. Use over-the-counter medications for symptoms.  If you have had no symptoms, but were exposed to someone who was positive for COVID-19, you will need to quarantine and self-isolate for 14 days from the date of exposure.    Please continue good preventive care measures, including:  frequent hand-washing, avoid touching your face, cover coughs/sneezes, stay out of crowds and keep a 6 foot distance from others.  Clean hard surfaces touched frequently with disinfectant cleaning products.   Please check in with your primary care provider about your positive test result.  Go to the nearest urgent care or ED for assessment if you have severe breathlessness or severe weakness/fatigue (ex needing new help getting out of bed or to the bathroom).  Members of your household will also need to quarantine for 14 days from the date of your positive test. You may be contacted to discuss possible treatment options, and you may also be contacted by the health department for follow up. Please call [place] at [phone] if you have any questions or concerns.     

## 2020-04-08 ENCOUNTER — Emergency Department: Payer: BC Managed Care – PPO

## 2020-04-08 ENCOUNTER — Other Ambulatory Visit: Payer: Self-pay

## 2020-04-08 ENCOUNTER — Inpatient Hospital Stay
Admission: EM | Admit: 2020-04-08 | Discharge: 2020-04-13 | DRG: 178 | Disposition: A | Discharge status: Home / Self Care | Payer: BC Managed Care – PPO | Attending: Internal Medicine | Admitting: Internal Medicine

## 2020-04-08 DIAGNOSIS — R042 Hemoptysis: Secondary | ICD-10-CM | POA: Diagnosis present

## 2020-04-08 DIAGNOSIS — K219 Gastro-esophageal reflux disease without esophagitis: Secondary | ICD-10-CM | POA: Diagnosis present

## 2020-04-08 DIAGNOSIS — Z720 Tobacco use: Secondary | ICD-10-CM | POA: Diagnosis not present

## 2020-04-08 DIAGNOSIS — E860 Dehydration: Secondary | ICD-10-CM | POA: Diagnosis not present

## 2020-04-08 DIAGNOSIS — J189 Pneumonia, unspecified organism: Secondary | ICD-10-CM | POA: Diagnosis present

## 2020-04-08 DIAGNOSIS — R509 Fever, unspecified: Secondary | ICD-10-CM | POA: Diagnosis not present

## 2020-04-08 DIAGNOSIS — F17210 Nicotine dependence, cigarettes, uncomplicated: Secondary | ICD-10-CM | POA: Diagnosis present

## 2020-04-08 DIAGNOSIS — J984 Other disorders of lung: Secondary | ICD-10-CM

## 2020-04-08 DIAGNOSIS — Z8042 Family history of malignant neoplasm of prostate: Secondary | ICD-10-CM

## 2020-04-08 DIAGNOSIS — J851 Abscess of lung with pneumonia: Secondary | ICD-10-CM | POA: Diagnosis present

## 2020-04-08 DIAGNOSIS — Z8249 Family history of ischemic heart disease and other diseases of the circulatory system: Secondary | ICD-10-CM

## 2020-04-08 DIAGNOSIS — J852 Abscess of lung without pneumonia: Secondary | ICD-10-CM | POA: Diagnosis not present

## 2020-04-08 DIAGNOSIS — Z20822 Contact with and (suspected) exposure to covid-19: Secondary | ICD-10-CM | POA: Diagnosis present

## 2020-04-08 DIAGNOSIS — Z8616 Personal history of COVID-19: Secondary | ICD-10-CM | POA: Diagnosis present

## 2020-04-08 DIAGNOSIS — Z79899 Other long term (current) drug therapy: Secondary | ICD-10-CM | POA: Diagnosis not present

## 2020-04-08 DIAGNOSIS — G4700 Insomnia, unspecified: Secondary | ICD-10-CM | POA: Diagnosis present

## 2020-04-08 LAB — CBC WITH DIFFERENTIAL/PLATELET
Abs Immature Granulocytes: 0.09 10*3/uL — ABNORMAL HIGH (ref 0.00–0.07)
Basophils Absolute: 0 10*3/uL (ref 0.0–0.1)
Basophils Relative: 0 %
Eosinophils Absolute: 0.1 10*3/uL (ref 0.0–0.5)
Eosinophils Relative: 0 %
HCT: 41.2 % (ref 39.0–52.0)
Hemoglobin: 14.1 g/dL (ref 13.0–17.0)
Immature Granulocytes: 1 %
Lymphocytes Relative: 10 %
Lymphs Abs: 1.7 10*3/uL (ref 0.7–4.0)
MCH: 30.7 pg (ref 26.0–34.0)
MCHC: 34.2 g/dL (ref 30.0–36.0)
MCV: 89.8 fL (ref 80.0–100.0)
Monocytes Absolute: 1.4 10*3/uL — ABNORMAL HIGH (ref 0.1–1.0)
Monocytes Relative: 8 %
Neutro Abs: 14 10*3/uL — ABNORMAL HIGH (ref 1.7–7.7)
Neutrophils Relative %: 81 %
Platelets: 419 10*3/uL — ABNORMAL HIGH (ref 150–400)
RBC: 4.59 MIL/uL (ref 4.22–5.81)
RDW: 12.5 % (ref 11.5–15.5)
WBC: 17.3 10*3/uL — ABNORMAL HIGH (ref 4.0–10.5)
nRBC: 0 % (ref 0.0–0.2)

## 2020-04-08 LAB — BASIC METABOLIC PANEL
Anion gap: 12 (ref 5–15)
BUN: 14 mg/dL (ref 6–20)
CO2: 24 mmol/L (ref 22–32)
Calcium: 8.5 mg/dL — ABNORMAL LOW (ref 8.9–10.3)
Chloride: 99 mmol/L (ref 98–111)
Creatinine, Ser: 0.94 mg/dL (ref 0.61–1.24)
GFR, Estimated: >60 mL/min (ref >60)
Glucose, Bld: 121 mg/dL — ABNORMAL HIGH (ref 70–99)
Potassium: 3.7 mmol/L (ref 3.5–5.1)
Sodium: 135 mmol/L (ref 135–145)

## 2020-04-08 LAB — URINE DRUG SCREEN, QUALITATIVE (ARMC ONLY)
Amphetamines, Ur Screen: NOT DETECTED
Barbiturates, Ur Screen: NOT DETECTED
Benzodiazepine, Ur Scrn: NOT DETECTED
Cannabinoid 50 Ng, Ur ~~LOC~~: POSITIVE — AB
Cocaine Metabolite,Ur ~~LOC~~: NOT DETECTED
MDMA (Ecstasy)Ur Screen: NOT DETECTED
Methadone Scn, Ur: NOT DETECTED
Opiate, Ur Screen: NOT DETECTED
Phencyclidine (PCP) Ur S: NOT DETECTED
Tricyclic, Ur Screen: NOT DETECTED

## 2020-04-08 LAB — CBC
HCT: 41.7 % (ref 39.0–52.0)
Hemoglobin: 14.5 g/dL (ref 13.0–17.0)
MCH: 30.5 pg (ref 26.0–34.0)
MCHC: 34.8 g/dL (ref 30.0–36.0)
MCV: 87.6 fL (ref 80.0–100.0)
Platelets: 381 10*3/uL (ref 150–400)
RBC: 4.76 MIL/uL (ref 4.22–5.81)
RDW: 12.4 % (ref 11.5–15.5)
WBC: 18.2 10*3/uL — ABNORMAL HIGH (ref 4.0–10.5)
nRBC: 0 % (ref 0.0–0.2)

## 2020-04-08 LAB — PROCALCITONIN: Procalcitonin: <0.1 ng/mL

## 2020-04-08 LAB — TROPONIN I (HIGH SENSITIVITY)
Troponin I (High Sensitivity): 10 ng/L (ref <18)
Troponin I (High Sensitivity): 21 ng/L — ABNORMAL HIGH (ref <18)

## 2020-04-08 LAB — ETHANOL: Alcohol, Ethyl (B): <10 mg/dL (ref <10)

## 2020-04-08 LAB — HEPATIC FUNCTION PANEL
ALT: 44 U/L (ref 0–44)
AST: 28 U/L (ref 15–41)
Albumin: 3.7 g/dL (ref 3.5–5.0)
Alkaline Phosphatase: 86 U/L (ref 38–126)
Bilirubin, Direct: 0.2 mg/dL (ref 0.0–0.2)
Indirect Bilirubin: 0.6 mg/dL (ref 0.3–0.9)
Total Bilirubin: 0.8 mg/dL (ref 0.3–1.2)
Total Protein: 8.5 g/dL — ABNORMAL HIGH (ref 6.5–8.1)

## 2020-04-08 LAB — C-REACTIVE PROTEIN: CRP: 26.5 mg/dL — ABNORMAL HIGH (ref <1.0)

## 2020-04-08 LAB — SEDIMENTATION RATE: Sed Rate: 85 mm/hr — ABNORMAL HIGH (ref 0–15)

## 2020-04-08 LAB — LACTIC ACID, PLASMA
Lactic Acid, Venous: 1.3 mmol/L (ref 0.5–1.9)
Lactic Acid, Venous: 1.2 mmol/L (ref 0.5–1.9)

## 2020-04-08 LAB — MAGNESIUM: Magnesium: 2.1 mg/dL (ref 1.7–2.4)

## 2020-04-08 LAB — RESPIRATORY PANEL BY RT PCR (FLU A&B, COVID)
Influenza A by PCR: NEGATIVE
Influenza B by PCR: NEGATIVE
SARS Coronavirus 2 by RT PCR: NEGATIVE

## 2020-04-08 LAB — CK: Total CK: 101 U/L (ref 49–397)

## 2020-04-08 LAB — PROTIME-INR
INR: 1.1 (ref 0.8–1.2)
Prothrombin Time: 13.6 seconds (ref 11.4–15.2)

## 2020-04-08 MED ORDER — LACTATED RINGERS IV BOLUS (SEPSIS)
500.0000 mL | Freq: Once | INTRAVENOUS | Status: AC
  Administered 2020-04-08: 500 mL via INTRAVENOUS

## 2020-04-08 MED ORDER — HYDROCODONE-ACETAMINOPHEN 5-325 MG PO TABS
1.0000 | ORAL_TABLET | ORAL | Status: DC | PRN
  Administered 2020-04-09 (×2): 2 via ORAL
  Filled 2020-04-08 (×2): qty 2

## 2020-04-08 MED ORDER — IOHEXOL 350 MG/ML SOLN
75.0000 mL | Freq: Once | INTRAVENOUS | Status: AC | PRN
  Administered 2020-04-08: 75 mL via INTRAVENOUS

## 2020-04-08 MED ORDER — LACTATED RINGERS IV SOLN: INTRAVENOUS | Status: DC

## 2020-04-08 MED ORDER — SODIUM CHLORIDE 0.9 % IV SOLN: 75.0000 mL/h | INTRAVENOUS | Status: DC

## 2020-04-08 MED ORDER — LACTATED RINGERS IV BOLUS (SEPSIS)
1000.0000 mL | Freq: Once | INTRAVENOUS | Status: AC
  Administered 2020-04-08: 1000 mL via INTRAVENOUS

## 2020-04-08 MED ORDER — PIPERACILLIN-TAZOBACTAM 3.375 G IVPB
3.3750 g | Freq: Three times a day (TID) | INTRAVENOUS | Status: AC
  Administered 2020-04-09 – 2020-04-12 (×11): 3.375 g via INTRAVENOUS
  Filled 2020-04-08 (×11): qty 50

## 2020-04-08 MED ORDER — SODIUM CHLORIDE 0.9 % IV SOLN
2.0000 g | Freq: Once | INTRAVENOUS | Status: AC
  Administered 2020-04-08: 2 g via INTRAVENOUS
  Filled 2020-04-08: qty 2

## 2020-04-08 MED ORDER — VANCOMYCIN HCL IN DEXTROSE 1-5 GM/200ML-% IV SOLN
1000.0000 mg | Freq: Two times a day (BID) | INTRAVENOUS | Status: DC
  Administered 2020-04-09: 1000 mg via INTRAVENOUS
  Filled 2020-04-08: qty 200

## 2020-04-08 MED ORDER — SODIUM CHLORIDE 0.9 % IV SOLN: 2.0000 g | Freq: Three times a day (TID) | INTRAVENOUS | Status: DC

## 2020-04-08 MED ORDER — ACETAMINOPHEN 650 MG RE SUPP: 650.0000 mg | Freq: Four times a day (QID) | RECTAL | Status: DC | PRN

## 2020-04-08 MED ORDER — TRAZODONE HCL 50 MG PO TABS: 50.0000 mg | ORAL_TABLET | Freq: Every day | ORAL | Status: DC

## 2020-04-08 MED ORDER — ACETAMINOPHEN 325 MG PO TABS
650.0000 mg | ORAL_TABLET | Freq: Four times a day (QID) | ORAL | Status: DC | PRN
  Administered 2020-04-08 – 2020-04-10 (×3): 650 mg via ORAL
  Filled 2020-04-08 (×3): qty 2

## 2020-04-08 MED ORDER — VANCOMYCIN HCL IN DEXTROSE 1-5 GM/200ML-% IV SOLN
1000.0000 mg | Freq: Once | INTRAVENOUS | Status: DC
  Filled 2020-04-08: qty 200

## 2020-04-08 MED ORDER — SODIUM CHLORIDE 0.9 % IV SOLN: INTRAVENOUS | Status: DC

## 2020-04-08 MED ORDER — VANCOMYCIN HCL 2000 MG/400ML IV SOLN
2000.0000 mg | Freq: Once | INTRAVENOUS | Status: AC
  Administered 2020-04-08: 2000 mg via INTRAVENOUS
  Filled 2020-04-08: qty 400

## 2020-04-08 MED ORDER — NICOTINE 21 MG/24HR TD PT24
21.0000 mg | MEDICATED_PATCH | Freq: Every day | TRANSDERMAL | Status: DC
  Administered 2020-04-08 – 2020-04-13 (×6): 21 mg via TRANSDERMAL
  Filled 2020-04-08 (×6): qty 1

## 2020-04-08 NOTE — ED Provider Notes (Signed)
With a  [facility] Emergency Department Provider Note   ____________________________________________   First MD Initiated Contact with Patient 04/08/20 1841     (approximate)  I have reviewed the triage vital signs and the nursing notes.   HISTORY  Chief Complaint coughing up blood    HPI Tanner Berg is a 49 y.o. male the past medical history tobacco abuse who presents for hemoptysis, sore throat, and night sweats that have been present over the last 1.5 weeks.  Patient states that the symptoms began with night sweats and cough productive of foul-smelling sputum that began approximate 1.5 weeks ago.  Since that time, patient has began having hemoptysis over the last 3 days with associated sore throat.  Patient denies any fevers, weight loss, recent travel, recent incarceration, any known contacts with any communicable illness.         No past medical history on file.  Patient Active Problem List   Diagnosis Date Noted  . Cavitary pneumonia 04/08/2020    History reviewed. No pertinent surgical history.  Prior to Admission medications   Medication Sig Start Date End Date Taking? Authorizing Provider  cephALEXin (KEFLEX) 500 MG capsule Take 1 capsule (500 mg total) by mouth 4 (four) times daily. 05/16/16   Sharyn Creamer, MD  etodolac (LODINE) 200 MG capsule Take 1 capsule (200 mg total) by mouth every 8 (eight) hours. 10/06/17   Rebecka Apley, MD  zolpidem (AMBIEN) 10 MG tablet Take 1 tablet (10 mg total) by mouth at bedtime as needed for sleep. 07/20/15   Emily Filbert, MD    Allergies Patient has no known allergies.  No family history on file.  Social History Social History   Tobacco Use  . Smoking status: Current Every Day Smoker    Packs/day: 1.00    Types: Cigarettes  . Smokeless tobacco: Never Used  Substance Use Topics  . Alcohol use: No  . Drug use: Not on file    Review of Systems Constitutional: No fever/chills Eyes:  No visual changes. ENT: No sore throat. Cardiovascular: Denies chest pain. Respiratory: Denies shortness of breath. Gastrointestinal: No abdominal pain.  No nausea, no vomiting.  No diarrhea. Genitourinary: Negative for dysuria. Musculoskeletal: Negative for acute arthralgias Skin: Negative for rash. Neurological: Negative for headaches, weakness/numbness/paresthesias in any extremity Psychiatric: Negative for suicidal ideation/homicidal ideation   ____________________________________________   PHYSICAL EXAM:  VITAL SIGNS: ED Triage Vitals  Enc Vitals Group     BP 04/08/20 1311 (!) 144/78     Pulse Rate 04/08/20 1311 (!) 108     Resp 04/08/20 1311 20     Temp 04/08/20 1311 98.9 F (37.2 C)     Temp Source 04/08/20 1311 Oral     SpO2 04/08/20 1311 97 %     Weight 04/08/20 1312 170 lb (77.1 kg)     Height 04/08/20 1312 5\' 10"  (1.778 m)     Head Circumference --      Peak Flow --      Pain Score 04/08/20 1312 0     Pain Loc --      Pain Edu? --      Excl. in GC? --    Constitutional: Alert and oriented. Well appearing and in no acute distress. Eyes: Conjunctivae are normal. PERRL. Head: Atraumatic. Nose: No congestion/rhinnorhea. Mouth/Throat: Mucous membranes are moist. Neck: No stridor Cardiovascular: Grossly normal heart sounds.  Good peripheral circulation. Respiratory: Normal respiratory effort.  No retractions. Gastrointestinal: Soft and nontender. No  distention. Musculoskeletal: No obvious deformities Neurologic:  Normal speech and language. No gross focal neurologic deficits are appreciated. Skin:  Skin is warm and dry. No rash noted. Psychiatric: Mood and affect are normal. Speech and behavior are normal.  ____________________________________________   LABS (all labs ordered are listed, but only abnormal results are displayed)  Labs Reviewed  BASIC METABOLIC PANEL - Abnormal; Notable for the following components:      Result Value   Glucose, Bld 121 (*)     Calcium 8.5 (*)    All other components within normal limits  CBC - Abnormal; Notable for the following components:   WBC 18.2 (*)    All other components within normal limits  CULTURE, BLOOD (ROUTINE X 2)  CULTURE, BLOOD (ROUTINE X 2)  RESPIRATORY PANEL BY RT PCR (FLU A&B, COVID)  MRSA PCR SCREENING  ACID FAST SMEAR (AFB, MYCOBACTERIA)  ACID FAST CULTURE WITH REFLEXED SENSITIVITIES (MYCOBACTERIA)  EXPECTORATED SPUTUM ASSESSMENT W REFEX TO RESP CULTURE  QUANTIFERON-TB GOLD PLUS  URINE DRUG SCREEN, QUALITATIVE ([facility] ONLY)  HEPATIC FUNCTION PANEL  CBC WITH DIFFERENTIAL/PLATELET  PROTIME-INR  HIV ANTIBODY (ROUTINE TESTING W REFLEX)  PROCALCITONIN  LEGIONELLA PNEUMOPHILA SEROGP 1 UR AG  STREP PNEUMONIAE URINARY ANTIGEN  PROCALCITONIN  LACTIC ACID, PLASMA  LACTIC ACID, PLASMA  ETHANOL  TROPONIN I (HIGH SENSITIVITY)  TROPONIN I (HIGH SENSITIVITY)    RADIOLOGY  ED MD interpretation: 2 view x-ray of the chest shows abnormal opacities within the right chest  CT angiography of the chest shows 2 cavitary lesions within the right lower lobe with reactive lymphadenopathy that are likely to represent multifocal lung abscesses or possible neoplasm  Official radiology report(s): DG Chest 2 View  Result Date: 04/08/2020 CLINICAL DATA:  Hemoptysis.  Sore throat and sweating. EXAM: CHEST - 2 VIEW COMPARISON:  Report from chest radiograph 11/14/2003 FINDINGS: Abnormal opacities in the right chest including a 5.7 by 4.8 cm opacity in the right mid lung with central lucency, and a 5.2 by 3.2 cm right basilar opacity with central lucency. Possibilities may include cavitary pneumonia or malignancy. No blunting of the costophrenic angles. Cardiac and mediastinal margins appear normal. IMPRESSION: 1. Abnormal opacities with central lucencies in the right chest, differential diagnostic considerations include cavitary pneumonia or malignancy. Chest CT is recommended for further characterization. 2. No  pleural effusion or airspace opacity observed. Electronically Signed   By: Gaylyn Rong M.D.   On: 04/08/2020 15:29   CT Angio Chest PE W/Cm &/Or Wo Cm  Result Date: 04/08/2020 CLINICAL DATA:  Cavitary lesions on recent chest x-ray EXAM: CT ANGIOGRAPHY CHEST WITH CONTRAST TECHNIQUE: Multidetector CT imaging of the chest was performed using the standard protocol during bolus administration of intravenous contrast. Multiplanar CT image reconstructions and MIPs were obtained to evaluate the vascular anatomy. CONTRAST:  95mL OMNIPAQUE IOHEXOL 350 MG/ML SOLN COMPARISON:  Chest x-ray from earlier in the same day. FINDINGS: Cardiovascular: Thoracic aorta and its branches are within normal limits. No aneurysmal dilatation or dissection is seen. Pulmonary artery shows a normal branching pattern. No filling defect to suggest pulmonary embolism is seen. No coronary calcifications are noted Mediastinum/Nodes: Thoracic inlet is unremarkable. 9 mm short axis lymph node is noted in the right paratracheal region. 14 mm short axis subcarinal node is seen. Right hilar lymph nodes are noted. The largest of these measures 11 mm in short axis. The esophagus as visualized is within normal limits. Lungs/Pleura: Left lung is well aerated. No focal infiltrate or sizable effusion is seen.  There are 2 cavitary lesions identified within the right lower lobe. The more lateral of these measures 3.9 x 3.6 cm. Air-fluid level is noted within. Slightly more superior and medial there is a 3.9 x 3.3 cm cavitary lesion with air-fluid level within. No sizable effusion or other focal parenchymal abnormality is noted. Upper Abdomen: Visualized upper abdomen shows fatty infiltration of the liver. Musculoskeletal: Degenerative changes of the thoracic spine are noted. No acute abnormality noted. Review of the MIP images confirms the above findings. IMPRESSION: Cavitary lesions within the right lower lobe with reactive lymphadenopathy. These  changes likely represent multifocal lung abscesses although the possibility of neoplasm cannot be totally excluded. Follow-up CT following appropriate therapy is recommended. Electronically Signed   By: Alcide Clever M.D.   On: 04/08/2020 18:14    ____________________________________________   PROCEDURES  Procedure(s) performed (including Critical Care):  Procedures   ____________________________________________   INITIAL IMPRESSION / ASSESSMENT AND PLAN / ED COURSE  As part of my medical decision making, I reviewed the following data within the electronic MEDICAL RECORD NUMBER Nursing notes reviewed and incorporated, Labs reviewed, EKG interpreted, Old chart reviewed, Radiograph reviewed and Notes from prior ED visits reviewed and incorporated        Presents with hemoptysis, cough, and malaise concerning for pneumonia.  DDx: PE, COPD exacerbation, Pneumothorax, TB, Atypical ACS, Esophageal Rupture, Toxic Exposure, Foreign Body Airway Obstruction.  Workup: CXR, CTA chest CBC, CMP, lactate, troponin  Given History, Exam, and Workup presentation most consistent with pneumonia.  Findings: Multiple right-sided cavitary lesions in the lung as documented above  Tx: Cefepime, vancomycin  Reassessment 1824: Patient resting comfortably in no acute distress.  Patient updated on results of his CT scan and agrees with plan for admission to the internal medicine service.  Disposition: Admit      ____________________________________________   FINAL CLINICAL IMPRESSION(S) / ED DIAGNOSES  Final diagnoses:  Cavitary pneumonia  Abscess of right lung with pneumonia, unspecified part of lung [facility])     ED Discharge Orders    None       Note:  This document was prepared using Dragon voice recognition software and may include unintentional dictation errors.   Merwyn Katos, MD 04/08/20 1950

## 2020-04-08 NOTE — H&P (Signed)
Tanner Berg WUJ:[id number] DOB: August 09, 1970 DOA: 04/08/2020     PCP: Titus Mould, NP   Outpatient Specialists:  NONE   Patient arrived to ER on 04/08/20 at 1232 Referred by Attending Merwyn Katos, MD   Patient coming from: home Lives  With family    Chief Complaint:  Chief Complaint  Patient presents with  . coughing up blood    HPI: Tanner Berg is a 49 y.o. male with medical history significant of Covid in Feb 2021 hx of Dental abscess,     Presented with   4 d hx of blood tinged sputum, Sore throat, cough and night sweats Reports about 2 wk ago he had an episode of possible aspiration when he drank milk and cookies before bed time. He does not take Protonix(states this stuff will kill you) but has severe GERD. He woke up at night coughing and coughed up burning liquid. 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  He does keep parakeets at home but in his son's room. Patient reports he never did any IV drug use in his life.  He is a smoker but has been cutting down he has not drank ever since his 49s. 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  Patient reports he have had Covid in February and he was very sick although did not receive required hospitalization.  Patient feels very strongly against getting vaccinated at this time stating "he would rather die" Infectious risk factors:  Reports cough,    Has  NOt been vaccinated against COVID (refuses)   Initial COVID TEST  NEGATIVE   Lab Results  Component Value Date   SARSCOV2NAA NEGATIVE 04/08/2020   SARSCOV2NAA Detected (A) 08/09/2019   SARSCOV2NAA Not Detected 04/25/2019      While in ER: CT imaging showed bilateral cavitary lesions for fluid. Culture HIV was ordered Blood cultures sputum Started on cefepime and Vanco Hospitalist was called for admission for cavitary  pneumonia and hemoptysis  The following Work up has been ordered so far:  Orders Placed This Encounter  Procedures  . Blood culture (routine x 2)  . Respiratory Panel by RT PCR (Flu A&B, Covid) - Nasopharyngeal Swab  . MRSA PCR Screening  . DG Chest 2 View  . CT Angio Chest PE W/Cm &/Or Wo Cm  . Basic metabolic panel  . CBC  . QuantiFERON-TB Gold Plus  . DO NOT delay antibiotics if unable to obtain blood culture.  . Code Sepsis activation.  This occurs automatically when order is signed and prioritizes pharmacy, lab, and radiology services for STAT collections and interventions.  If [facility] downtime, call [facility] [phone]) to activate Code Sepsis.  Marland Kitchen pharmacy consult  . vancomycin per pharmacy consult  . ceFEPime (MAXIPIME) per pharmacy consult  . Consult to hospitalist  ALL PATIENTS BEING ADMITTED/HAVING PROCEDURES NEED COVID-19 SCREENING  . Insert 2nd peripheral IV if not already present.    Following Medications were ordered in ER: Medications  lactated ringers infusion (has no administration in time range)  lactated ringers bolus 1,000 mL (has no administration in time range)    And  lactated ringers bolus 1,000 mL (has no administration in time range)    And  lactated ringers bolus 500 mL (has no administration in time range)  vancomycin (VANCOCIN) IVPB 1000 mg/200 mL premix (has  no administration in time range)  ceFEPIme (MAXIPIME) 2 g in sodium chloride 0.9 % 100 mL IVPB (has no administration in time range)  iohexol (OMNIPAQUE) 350 MG/ML injection 75 mL (75 mLs Intravenous Contrast Given 04/08/20 1756)        Consult Orders  (From admission, onward)         Start     Ordered   04/08/20 1835  Consult to hospitalist  ALL PATIENTS BEING ADMITTED/HAVING PROCEDURES NEED COVID-19 SCREENING  Once       Comments: ALL PATIENTS BEING ADMITTED/HAVING PROCEDURES NEED COVID-19 SCREENING  Provider:  (Not yet assigned)  Question Answer Comment  Place call to:  [phone]   Reason for Consult Admit      04/08/20 1834          Significant initial  Findings: Abnormal Labs Reviewed  BASIC METABOLIC PANEL - Abnormal; Notable for the following components:      Result Value   Glucose, Bld 121 (*)    Calcium 8.5 (*)    All other components within normal limits  CBC - Abnormal; Notable for the following components:   WBC 18.2 (*)    All other components within normal limits  HEPATIC FUNCTION PANEL - Abnormal; Notable for the following components:   Total Protein 8.5 (*)    All other components within normal limits  CBC WITH DIFFERENTIAL/PLATELET - Abnormal; Notable for the following components:   WBC 17.3 (*)    Platelets 419 (*)    Neutro Abs 14.0 (*)    Monocytes Absolute 1.4 (*)    Abs Immature Granulocytes 0.09 (*)    All other components within normal limits  TROPONIN I (HIGH SENSITIVITY) - Abnormal; Notable for the following components:   Troponin I (High Sensitivity) 21 (*)    All other components within normal limits    Otherwise labs showing:    Recent Labs  Lab 04/08/20 1319  NA 135  K 3.7  CO2 24  GLUCOSE 121*  BUN 14  CREATININE 0.94  CALCIUM 8.5*    Cr   stable,   Lab Results  Component Value Date   CREATININE 0.94 04/08/2020   CREATININE 1.04 10/05/2017   CREATININE 1.09 07/20/2015    Recent Labs  Lab 04/08/20 1924  AST 28  ALT 44  ALKPHOS 86  BILITOT 0.8  PROT 8.5*  ALBUMIN 3.7   Lab Results  Component Value Date   CALCIUM 8.5 (L) 04/08/2020      WBC      Component Value Date/Time   WBC 18.2 (H) 04/08/2020 1319   Plt: Lab Results  Component Value Date   PLT 381 04/08/2020    Lactic Acid, Venous    Component Value Date/Time   LATICACIDVEN 1.3 04/08/2020 1924    Procalcitonin Ordered   HG/HCT  stable,      Component Value Date/Time   HGB 14.5 04/08/2020 1319   HCT 41.7 04/08/2020 1319   MCV 87.6 04/08/2020 1319    Troponin 21   ECG:   Ordered     UA  not ordered       CK   Ordered   CTA chest -  no PE Right lower lobe cavitary lesions     ED Triage Vitals  Enc Vitals Group     BP 04/08/20 1311 (!) 144/78     Pulse Rate 04/08/20 1311 (!) 108     Resp 04/08/20 1311 20     Temp 04/08/20 1311 98.9 F (  37.2 C)     Temp Source 04/08/20 1311 Oral     SpO2 04/08/20 1311 97 %     Weight 04/08/20 1312 170 lb (77.1 kg)     Height 04/08/20 1312  (1.778 m)     Head Circumference --      Peak Flow --      Pain Score 04/08/20 1312 0     Pain Loc --      Pain Edu? --      Excl. in GC? --   TMAX(24)@       Latest  Blood pressure (!) 131/94, pulse 97, temperature 98.4 F (36.9 C), temperature source Oral, resp. rate 18, height  (1.778 m), weight 77.1 kg, SpO2 99 %.    Review of Systems:    Pertinent positives include:  chills, fatigue  productive cough  coughing up of blood.  change in color of mucus Constitutional:  No weight loss, night sweats, Fevers, , weight loss  HEENT:  No headaches, Difficulty swallowing,Tooth/dental problems,Sore throat,  No sneezing, itching, ear ache, nasal congestion, post nasal drip,  Cardio-vascular:  No chest pain, Orthopnea, PND, anasarca, dizziness, palpitations.no Bilateral lower extremity swelling  GI:  No heartburn, indigestion, abdominal pain, nausea, vomiting, diarrhea, change in bowel habits, loss of appetite, melena, blood in stool, hematemesis Resp:  no shortness of breath at rest. No dyspnea on exertion, No excess mucus, no No non-productive cough,  .No wheezing. Skin:  no rash or lesions. No jaundice GU:  no dysuria, change in color of urine, no urgency or frequency. No straining to urinate.  No flank pain.  Musculoskeletal:  No joint pain or no joint swelling. No decreased range of motion. No back pain.  Psych:  No change in mood or affect. No depression or anxiety. No memory loss.  Neuro: no localizing neurological complaints, no tingling, no weakness, no double vision, no gait abnormality, no  slurred speech, no confusion  All systems reviewed and apart from HOPI all are negative  Past Medical History:  No past medical history on file.    History reviewed. No pertinent surgical history.  Social History:  Ambulatory   Independently     reports that he has been smoking cigarettes. He has been smoking about 1.00 pack per day. He has never used smokeless tobacco. He reports that he does not drink alcohol. No history on file for drug use.   Family History:    Family History  Problem Relation Age of Onset  . Hypertension Other     Allergies: No Known Allergies   Prior to Admission medications   Medication Sig Start Date End Date Taking? Authorizing Provider  cephALEXin (KEFLEX) 500 MG capsule Take 1 capsule (500 mg total) by mouth 4 (four) times daily. 05/16/16   Sharyn Creamer, MD  etodolac (LODINE) 200 MG capsule Take 1 capsule (200 mg total) by mouth every 8 (eight) hours. 10/06/17   Rebecka Apley, MD  zolpidem (AMBIEN) 10 MG tablet Take 1 tablet (10 mg total) by mouth at bedtime as needed for sleep. 07/20/15   Emily Filbert, MD   Physical Exam: Vitals with BMI 04/08/2020 04/08/2020 04/08/2020  Height - -   Weight - - 170 lbs  BMI - - 24.39  Systolic 131 136 -  Diastolic 94 79 -  Pulse 97 98 -     1. General:  in No   Acute distress   acutely ill -appearing 2. Psychological: Alert and  Oriented 3. Head/ENT:    Dry Mucous Membranes                          Head Non traumatic, neck supple                           Poor Dentition 4. SKIN:  decreased Skin turgor,  Skin clean Dry and intact no rash 5. Heart: Regular rate and rhythm no  Murmur, no Rub or gallop 6. Lungs: , no wheezes or crackles   7. Abdomen: Soft non-tender, Non distended bowel sounds present 8. Lower extremities: no clubbing, cyanosis, no edema 9. Neurologically Grossly intact, moving all 4 extremities equally  10. MSK: Normal range of motion   All other LABS:     Recent  Labs  Lab 04/08/20 1319  WBC 18.2*  HGB 14.5  HCT 41.7  MCV 87.6  PLT 381     Recent Labs  Lab 04/08/20 1319  NA 135  K 3.7  CL 99  CO2 24  GLUCOSE 121*  BUN 14  CREATININE 0.94  CALCIUM 8.5*     No results for input(s): AST, ALT, ALKPHOS, BILITOT, PROT, ALBUMIN in the last 168 hours.     Cultures: No results found for: SDES, SPECREQUEST, CULT, REPTSTATUS   Radiological Exams on Admission: DG Chest 2 View  Result Date: 04/08/2020 CLINICAL DATA:  Hemoptysis.  Sore throat and sweating. EXAM: CHEST - 2 VIEW COMPARISON:  Report from chest radiograph 11/14/2003 FINDINGS: Abnormal opacities in the right chest including a 5.7 by 4.8 cm opacity in the right mid lung with central lucency, and a 5.2 by 3.2 cm right basilar opacity with central lucency. Possibilities may include cavitary pneumonia or malignancy. No blunting of the costophrenic angles. Cardiac and mediastinal margins appear normal. IMPRESSION: 1. Abnormal opacities with central lucencies in the right chest, differential diagnostic considerations include cavitary pneumonia or malignancy. Chest CT is recommended for further characterization. 2. No pleural effusion or airspace opacity observed. Electronically Signed   By: Gaylyn Rong M.D.   On: 04/08/2020 15:29   CT Angio Chest PE W/Cm &/Or Wo Cm  Result Date: 04/08/2020 CLINICAL DATA:  Cavitary lesions on recent chest x-ray EXAM: CT ANGIOGRAPHY CHEST WITH CONTRAST TECHNIQUE: Multidetector CT imaging of the chest was performed using the standard protocol during bolus administration of intravenous contrast. Multiplanar CT image reconstructions and MIPs were obtained to evaluate the vascular anatomy. CONTRAST:  20mL OMNIPAQUE IOHEXOL 350 MG/ML SOLN COMPARISON:  Chest x-ray from earlier in the same day. FINDINGS: Cardiovascular: Thoracic aorta and its branches are within normal limits. No aneurysmal dilatation or dissection is seen. Pulmonary artery shows a normal  branching pattern. No filling defect to suggest pulmonary embolism is seen. No coronary calcifications are noted Mediastinum/Nodes: Thoracic inlet is unremarkable. 9 mm short axis lymph node is noted in the right paratracheal region. 14 mm short axis subcarinal node is seen. Right hilar lymph nodes are noted. The largest of these measures 11 mm in short axis. The esophagus as visualized is within normal limits. Lungs/Pleura: Left lung is well aerated. No focal infiltrate or sizable effusion is seen. There are 2 cavitary lesions identified within the right lower lobe. The more lateral of these measures 3.9 x 3.6 cm. Air-fluid level is noted within. Slightly more superior and medial there is a 3.9 x 3.3 cm cavitary lesion with air-fluid level within. No sizable effusion or  other focal parenchymal abnormality is noted. Upper Abdomen: Visualized upper abdomen shows fatty infiltration of the liver. Musculoskeletal: Degenerative changes of the thoracic spine are noted. No acute abnormality noted. Review of the MIP images confirms the above findings. IMPRESSION: Cavitary lesions within the right lower lobe with reactive lymphadenopathy. These changes likely represent multifocal lung abscesses although the possibility of neoplasm cannot be totally excluded. Follow-up CT following appropriate therapy is recommended. Electronically Signed   By: Alcide CleverMark  Lukens M.D.   On: 04/08/2020 18:14    Chart has been reviewed    Assessment/Plan   49 y.o. male with medical history significant of Covid in Feb 2021 hx of Dental abscess,       Admitted for hemoptysis in the setting of cavitary lesions possibly resulting from aspiration  Present on Admission: . Hemoptysis - likley due to cavitary lesion, monitor on continuous pulse ox Send a message to pulmonary requesting consult  Cavitary pneumonia -unclear etiology most likely related to possible aspiration event few weeks ago given right lower lobe lesion but for completion  will order further work-up obtain blood and sputum cultures, obtain echogram to evaluate for potentially embolic source.  Obtain QuantiFERON gold TB and AFB, appreciate ID and pulmonology consult. Initiate broad-spectrum antibiotics Zosyn and vancomycin for tonight MRSA PCR We will try to simplify antibiotic regimen if able pending further work-up Keep n.p.o. post midnight in case patient may need any interventions  GERD -If patient agreeable could benefit from initiation of PPI  . Tobacco abuse -  - Spoke about importance of quitting spent 5 minutes discussing options for treatment, prior attempts at quitting, and dangers of smoking  -At this point patient is    interested in quitting  - order nicotine patch   - nursing tobacco cessation protocol  . History of COVID-19 -in February will retest, cavitary lesions are not common for Covid virus.  Patient at this point not interested in being vaccinated   Dehydration will rehydrate and follow fluid status  Other plan as per orders.  DVT prophylaxis:  SCD      Code Status:    Code Status: Not on file FULL CODE    care as per patient   I had personally discussed CODE STATUS with patient     Family Communication:   Family not at  Bedside    Disposition Plan:          To home once workup is complete and patient is stable   Following barriers for discharge:                           white count improving able to transition to PO antibiotics                             Will need to be able to tolerate PO                                                      Will need consultants to evaluate patient prior to discharge                                    Consults  called: Notified through Smart chart ID and pulmonology  Admission status:  ED Disposition    ED Disposition Condition Comment   Admit  Hospital Area: [facility] [[id number]]  Level of Care: Med-Surg [16]  Covid Evaluation: Symptomatic Person Under  Investigation (PUI)  Diagnosis: Cavitary pneumonia [[id number]]  Admitting Physician: Therisa Doyne [[id number]]  Attending Physician: Therisa Doyne [[id number]]  Estimated length of stay: past midnight tomorrow  Certification:: I certify this patient will need inpatient services for at least 2 midnights          inpatient     I Expect 2 midnight stay secondary to severity of patient's current illness need for inpatient interventions justified by the following: Tachycardia   Severe lab/radiological/exam abnormalities including:   Cavitary pneumonia   That are currently affecting medical management.   I expect  patient to be hospitalized for 2 midnights requiring inpatient medical care.  Patient is at high risk for adverse outcome (such as loss of life or disability) if not treated.  Indication for inpatient stay as follows:    Hemodynamic instability despite maximal medical therapy,    Need for operative/procedural  intervention    Need for IV antibiotics, IV fluids,     Level of care  tele  For  24H      Lab Results  Component Value Date   SARSCOV2NAA NEGATIVE 04/08/2020     Precautions: admitted as   Covid Negative    PPE: Used by the provider:   P100  eye Goggles,  Gloves       Barth Trella 04/08/2020, 9:22 PM    [facility]     after 2 AM please page floor coverage PA If 7AM-7PM, please contact the day team taking care of the patient using [email or web address]   Patient was evaluated in the context of the global COVID-19 pandemic, which necessitated consideration that the patient might be at risk for infection with the SARS-CoV-2 virus that causes COVID-19. Institutional protocols and algorithms that pertain to the evaluation of patients at risk for COVID-19 are in a state of rapid change based on information released by regulatory bodies including the CDC and federal and state organizations. These policies and algorithms were followed during the  patient's care.

## 2020-04-08 NOTE — Progress Notes (Signed)
CODE SEPSIS - PHARMACY COMMUNICATION  **Broad Spectrum Antibiotics should be administered within 1 hour of Sepsis diagnosis**  Time Code Sepsis Called/Page Received: 1839  Antibiotics Ordered: Vancomycin and Cefepime   Time of 1st antibiotic administration: @2101   Additional action taken by pharmacy: Jude, RN at [phone] and no response. Per chart review notes @ 1946, 9326, RN d/w Dr. Jonna Munro and awaiting blood culture draws before starting antibiotics.   If necessary, Name of Provider/Nurse Contacted: see above    Adela Glimpse ,PharmD Clinical Pharmacist  04/08/2020  7:05 PM

## 2020-04-08 NOTE — ED Notes (Signed)
Patient talking on cell phone. NAD noted at this time. Patient able to speak in complete sentences to this RN during V/S check

## 2020-04-08 NOTE — ED Notes (Signed)
Patient has very difficult vasculature, this RN attempted x2 and Tammy RN attempted x2 for second set of blood cultures without success. MD paged to ask if antibiotics can be started prior to second set of blood cultures being obtained.

## 2020-04-08 NOTE — ED Notes (Signed)
MD Doutova advised wait until two sets of blood cultures can be obtained before starting antibiotics. Lab called and advised first set of blood cultures was insufficient, lab advised will sent someone down to obtain blood cultures. Antibiotics will be started when blood cultures are completed.

## 2020-04-08 NOTE — Consult Note (Signed)
Pharmacy Antibiotic Note  Tanner Berg is a 49 y.o. male admitted on 04/08/2020 with pneumonia.  Pharmacy has been consulted for Cefepime and Vancomycin dosing. Vancomycin and cefepime were ordered in the ED but have not been given as a result of waiting for lab draws. Spoke with [employer], RN and will discontinue Vancomycin 1g and order the appropriate loading dose.   MRSA PCR ordered and pending.   Plan: Vancomycin IV 2g x1 loading dose, followed by maintenance dose of 1000 mg Q12H (Expected AUC 464.4 and Css min 12.7). Will follow up on Scr and MRSA and adjust accordingly.   Cefepime 2g Q8h - will start 8 hours after first dose is given.    Height: 5\' 10"  (177.8 cm) Weight: 77.1 kg (170 lb) IBW/kg (Calculated) : 73  Temp (24hrs), Avg:98.8 F (37.1 C), Min:98.4 F (36.9 C), Max:99.2 F (37.3 C)  Recent Labs  Lab 04/08/20 1319 04/08/20 1924  WBC 17.3*  18.2*  --   CREATININE 0.94  --   LATICACIDVEN  --  1.3    Estimated Creatinine Clearance: 98.2 mL/min (by C-G formula based on SCr of 0.94 mg/dL).    No Known Allergies  Antimicrobials this admission: 10/18 Cefepime 10/18 Vancomycin    Dose adjustments this admission:   Microbiology results: 10/17 BCx: pending  10/17 Sputum:  pending 10/17 MRSA PCR: pending  Thank you for allowing pharmacy to be a part of this patient's care.  11/17 04/08/2020 8:33 PM

## 2020-04-08 NOTE — Consult Note (Signed)
Pharmacy Antibiotic Note  Tanner Berg is a 49 y.o. male admitted on 04/08/2020 with pneumonia.  Pharmacy has been consulted for Cefepime and Vancomycin dosing.  Vancomycin and Zosyn were ordered in the ED but have not been given as a result of waiting for lab draws. Spoke with [employer], RN and will discontinue Vancomycin 1g and order the appropriate loading dose.   MRSA PCR ordered and pending.   Verbal from MD to d/c Cefepime and concern for anaerobic coverage.   Plan: Vancomycin IV 2g x1 loading dose, followed by maintenance dose of 1000 mg Q12H (Expected AUC 464.4 and Css min 12.7). Will follow up on Scr and MRSA and adjust accordingly.   Please f/u in AM on MRSA PCR results or obtaining MRSA PCR nasal swab if not already done.   Will order Zosyn 3.375g IV Q8H - will start ~ 8 hours after cefepime given as per discussion w/ Dr. Adela Glimpse..    Height: 5\' 10"  (177.8 cm) Weight: 77.1 kg (170 lb) IBW/kg (Calculated) : 73  Temp (24hrs), Avg:98.8 F (37.1 C), Min:98.4 F (36.9 C), Max:99.2 F (37.3 C)  Recent Labs  Lab 04/08/20 1319 04/08/20 1924 04/08/20 2051  WBC 17.3*  18.2*  --   --   CREATININE 0.94  --   --   LATICACIDVEN  --  1.3 1.2    Estimated Creatinine Clearance: 98.2 mL/min (by C-G formula based on SCr of 0.94 mg/dL).    No Known Allergies  Antimicrobials this admission: 10/18 Cefepime x1 10/18 Zosyn >> 10/18 Vancomycin  >>>   Dose adjustments this admission:   Microbiology results: 10/17 BCx: pending  10/17 Sputum:  pending 10/17 MRSA PCR: pending  Thank you for allowing pharmacy to be a part of this patient's care.  11/17 04/08/2020 9:41 PM

## 2020-04-08 NOTE — ED Triage Notes (Signed)
Pt to ED POV for multiple complaints. States he has been coughing up red tinged sputum since Tuesday, sore throat, cough and sweats.  Hx COVID in feb

## 2020-04-08 NOTE — Consult Note (Addendum)
PHARMACY -  BRIEF ANTIBIOTIC NOTE   Pharmacy has received consult(s) for Cefepime/Vancomycin from an ED provider.  The patient's profile has been reviewed for ht/wt/allergies/indication/available labs.    One time order(s) placed for vancomycin 1g x1 dose and cefepime 2g x1 dose. MRSA PCR ordered.  Further antibiotics/pharmacy consults should be ordered by admitting physician if indicated.                       Thank you, Katha Cabal 04/08/2020  7:06 PM

## 2020-04-08 NOTE — ED Notes (Signed)
Lab at bedside to obtain labs

## 2020-04-09 ENCOUNTER — Other Ambulatory Visit: Payer: Self-pay

## 2020-04-09 ENCOUNTER — Inpatient Hospital Stay (HOSPITAL_COMMUNITY)
Admit: 2020-04-09 | Discharge: 2020-04-09 | Disposition: A | Discharge status: Home / Self Care | Payer: BC Managed Care – PPO | Attending: Internal Medicine | Admitting: Internal Medicine

## 2020-04-09 DIAGNOSIS — R509 Fever, unspecified: Secondary | ICD-10-CM | POA: Diagnosis not present

## 2020-04-09 DIAGNOSIS — J852 Abscess of lung without pneumonia: Secondary | ICD-10-CM | POA: Diagnosis not present

## 2020-04-09 LAB — ECHOCARDIOGRAM COMPLETE
AR max vel: 2.52 cm2
AV Area VTI: 2.42 cm2
AV Area mean vel: 2.32 cm2
AV Mean grad: 5 mmHg
AV Peak grad: 10.5 mmHg
Ao pk vel: 1.62 m/s
Area-P 1/2: 5.54 cm2
Height: 70 in
S' Lateral: 3.43 cm
Weight: 2720 oz

## 2020-04-09 LAB — MRSA PCR SCREENING: MRSA by PCR: NEGATIVE

## 2020-04-09 LAB — EXPECTORATED SPUTUM ASSESSMENT W GRAM STAIN, RFLX TO RESP C

## 2020-04-09 LAB — HIV ANTIBODY (ROUTINE TESTING W REFLEX): HIV Screen 4th Generation wRfx: NONREACTIVE

## 2020-04-09 LAB — PHOSPHORUS: Phosphorus: 2.7 mg/dL (ref 2.5–4.6)

## 2020-04-09 LAB — STREP PNEUMONIAE URINARY ANTIGEN: Strep Pneumo Urinary Antigen: NEGATIVE

## 2020-04-09 LAB — PROCALCITONIN: Procalcitonin: <0.1 ng/mL

## 2020-04-09 LAB — TSH: TSH: 1.077 u[IU]/mL (ref 0.350–4.500)

## 2020-04-09 LAB — CREATININE, SERUM
Creatinine, Ser: 0.79 mg/dL (ref 0.61–1.24)
GFR, Estimated: >60 mL/min (ref >60)

## 2020-04-09 MED ORDER — SODIUM CHLORIDE 0.9 % IV SOLN: Freq: Once | INTRAVENOUS | Status: AC

## 2020-04-09 MED ORDER — GUAIFENESIN-DM 100-10 MG/5ML PO SYRP
5.0000 mL | ORAL_SOLUTION | ORAL | Status: DC | PRN
  Administered 2020-04-09 – 2020-04-12 (×2): 5 mL via ORAL
  Filled 2020-04-09 (×3): qty 5

## 2020-04-09 MED ORDER — TRAZODONE HCL 50 MG PO TABS
25.0000 mg | ORAL_TABLET | Freq: Every day | ORAL | Status: DC
  Administered 2020-04-09 – 2020-04-11 (×3): 25 mg via ORAL
  Filled 2020-04-09 (×3): qty 1

## 2020-04-09 NOTE — ED Notes (Signed)
Pt remains on airborne precautions. [facility] bed ordered. Will switch pt from stretcher to bed once received. Dinner tray ordered since pt's never arrived; pt states lunch tray arrived late; around 4pm.

## 2020-04-09 NOTE — ED Notes (Signed)
HOB adjusted and pt given another warm blanket as requested.

## 2020-04-09 NOTE — Progress Notes (Signed)
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  He said he aspirated at night about 2 weeks prior to admission after he drank milk and cookies shortly before bedtime.  He was found to have right lower lobe cavitary lesions concerning for multifocal abscess.      Assessment/Plan:   Principal Problem:   Cavitary pneumonia Active Problems:   Hemoptysis   Tobacco abuse   History of COVID-19   Dehydration   Body mass index is 24.39 kg/m.    PLAN  Right lower lobe cavitary pneumonia/multifocal abscess/hemoptysis: Continue empiric IV antibiotics.  Follow-up blood culture, sputum culture sputum AFBs.  ID specialist and pulmonologist have been consulted.  Dehydration: Improved.  Discontinue IV fluids.  Insomnia: Trazodone for sleep at night  Tobacco use disorder: Continue nicotine patch  Diet Order            Diet Heart Room service appropriate? Yes; Fluid consistency: Thin  Diet effective now                    Consultants:  Infectious disease  Pulmonologist  Procedures:  None    Medications:   . nicotine  21 mg Transdermal Daily  . traZODone  25 mg Oral QHS   Continuous Infusions: . piperacillin-tazobactam (ZOSYN)  IV Stopped (04/09/20 0900)  . vancomycin 1,000 mg (04/09/20 0927)     Anti-infectives (From admission, onward)   Start     Dose/Rate Route Frequency Ordered Stop   04/09/20 0900  vancomycin (VANCOCIN) IVPB 1000 mg/200 mL premix        1,000 mg 200 mL/hr over 60 Minutes Intravenous Every 12 hours 04/08/20 2046     04/09/20 0600  ceFEPIme (MAXIPIME) 2  g in sodium chloride 0.9 % 100 mL IVPB  Status:  Discontinued        2 g 200 mL/hr over 30 Minutes Intravenous Every 8 hours 04/08/20 2046 04/08/20 2113   04/09/20 0500  piperacillin-tazobactam (ZOSYN) IVPB 3.375 g        3.375 g 12.5 mL/hr over 240 Minutes Intravenous Every 8 hours 04/08/20 2113     04/08/20 2100  vancomycin (VANCOREADY) IVPB 2000 mg/400 mL        2,000 mg 200 mL/hr over 120 Minutes Intravenous  Once 04/08/20 2036 04/09/20 0052   04/08/20 1845  vancomycin (VANCOCIN) IVPB 1000 mg/200 mL premix  Status:  Discontinued        1,000 mg 200 mL/hr over 60 Minutes Intravenous  Once 04/08/20 1834 04/08/20 2036   04/08/20 1845  ceFEPIme (MAXIPIME) 2 g in sodium chloride 0.9 % 100 mL IVPB        2 g 200 mL/hr over 30 Minutes Intravenous  Once 04/08/20 1834 04/08/20 2131             Family Communication/Anticipated D/C date and plan/Code Status   DVT prophylaxis: SCDs Start: 04/08/20 2021     Code Status: Full Code  Family Communication: None Disposition Plan:    Status is: Inpatient  Remains inpatient appropriate because:IV treatments appropriate due to intensity of illness or inability  to take PO   Dispo: The patient is from: Home              Anticipated d/c is to: Home              Anticipated d/c date is: > 3 days              Patient currently is not medically stable to d/c.           Subjective:   He complains of cough productive of bloody sputum.  He also has trouble sleeping at night.  He said he uses trazodone at home.  He requested 25 mg of trazodone instead of 50 mg for sleep at night.  No fever, chills, chest pain or shortness of breath.  Objective:    Vitals:   04/09/20 0615 04/09/20 0630 04/09/20 0645 04/09/20 0800  BP:  107/60  120/67  Pulse: 100 94 98 91  Resp: 19 18 16 16   Temp:    99.5 F (37.5 C)  TempSrc:    Oral  SpO2: 96% 92% 94% 95%  Weight:      Height:       No data found.   Intake/Output Summary (Last 24  hours) at 04/09/2020 1035 Last data filed at 04/09/2020 0900 Gross per 24 hour  Intake 100 ml  Output --  Net 100 ml   Filed Weights   04/08/20 1312  Weight: 77.1 kg    Exam:  GEN: NAD SKIN: No rash EYES: EOMI ENT: MMM CV: RRR PULM: CTA B ABD: soft, ND, NT, +BS CNS: AAO x 3, non focal EXT: No edema or tenderness   Data Reviewed:   I have personally reviewed following labs and imaging studies:  Labs: Labs show the following:   Basic Metabolic Panel: Recent Labs  Lab 04/08/20 1319 04/08/20 2051 04/09/20 0527  NA 135  --   --   K 3.7  --   --   CL 99  --   --   CO2 24  --   --   GLUCOSE 121*  --   --   BUN 14  --   --   CREATININE 0.94  --  0.79  CALCIUM 8.5*  --   --   MG  --  2.1  --   PHOS  --   --  2.7   GFR Estimated Creatinine Clearance: 115.3 mL/min (by C-G formula based on SCr of 0.79 mg/dL). Liver Function Tests: Recent Labs  Lab 04/08/20 1924  AST 28  ALT 44  ALKPHOS 86  BILITOT 0.8  PROT 8.5*  ALBUMIN 3.7   No results for input(s): LIPASE, AMYLASE in the last 168 hours. No results for input(s): AMMONIA in the last 168 hours. Coagulation profile Recent Labs  Lab 04/08/20 2051  INR 1.1    CBC: Recent Labs  Lab 04/08/20 1319  WBC 17.3*  18.2*  NEUTROABS 14.0*  HGB 14.1  14.5  HCT 41.2  41.7  MCV 89.8  87.6  PLT 419*  381   Cardiac Enzymes: Recent Labs  Lab 04/08/20 2051  CKTOTAL 101   BNP (last 3 results) No results for input(s): PROBNP in the last 8760 hours. CBG: No results for input(s): GLUCAP in the last 168 hours. D-Dimer: No results for input(s): DDIMER in the last 72 hours. Hgb A1c: No results for input(s): HGBA1C in the last 72 hours. Lipid Profile: No results for input(s): CHOL, HDL, LDLCALC, TRIG, CHOLHDL, LDLDIRECT in the  last 72 hours. Thyroid function studies: Recent Labs    04/09/20 0527  TSH 1.077   Anemia work up: No results for input(s): VITAMINB12, FOLATE, FERRITIN, TIBC, IRON,  RETICCTPCT in the last 72 hours. Sepsis Labs: Recent Labs  Lab 04/08/20 1319 04/08/20 1924 04/08/20 2051 04/09/20 0527  PROCALCITON  --  <0.10  --  <0.10  WBC 17.3*  18.2*  --   --   --   LATICACIDVEN  --  1.3 1.2  --     Microbiology Recent Results (from the past 240 hour(s))  Respiratory Panel by RT PCR (Flu A&B, Covid) - Nasopharyngeal Swab     Status: None   Collection Time: 04/08/20  7:23 PM   Specimen: Nasopharyngeal Swab  Result Value Ref Range Status   SARS Coronavirus 2 by RT PCR NEGATIVE NEGATIVE Final    Comment: (NOTE) SARS-CoV-2 target nucleic acids are NOT DETECTED.  The SARS-CoV-2 RNA is generally detectable in upper respiratoy specimens during the acute phase of infection. The lowest concentration of SARS-CoV-2 viral copies this assay can detect is 131 copies/mL. A negative result does not preclude SARS-Cov-2 infection and should not be used as the sole basis for treatment or other patient management decisions. A negative result may occur with  improper specimen collection/handling, submission of specimen other than nasopharyngeal swab, presence of viral mutation(s) within the areas targeted by this assay, and inadequate number of viral copies (<131 copies/mL). A negative result must be combined with clinical observations, patient history, and epidemiological information. The expected result is Negative.  Fact Sheet for Patients:  https://www.moore.com/  Fact Sheet for Healthcare Providers:  https://www.young.biz/  This test is no t yet approved or cleared by the [place] FDA and  has been authorized for detection and/or diagnosis of SARS-CoV-2 by FDA under an Emergency Use Authorization (EUA). This EUA will remain  in effect (meaning this test can be used) for the duration of the COVID-19 declaration under Section 564(b)(1) of the Act, 21 U.S.C. section 360bbb-3(b)(1), unless the authorization is terminated  or revoked sooner.     Influenza A by PCR NEGATIVE NEGATIVE Final   Influenza B by PCR NEGATIVE NEGATIVE Final    Comment: (NOTE) The Xpert Xpress SARS-CoV-2/FLU/RSV assay is intended as an aid in  the diagnosis of influenza from Nasopharyngeal swab specimens and  should not be used as a sole basis for treatment. Nasal washings and  aspirates are unacceptable for Xpert Xpress SARS-CoV-2/FLU/RSV  testing.  Fact Sheet for Patients: https://www.moore.com/  Fact Sheet for Healthcare Providers: https://www.young.biz/  This test is not yet approved or cleared by the [place] FDA and  has been authorized for detection and/or diagnosis of SARS-CoV-2 by  FDA under an Emergency Use Authorization (EUA). This EUA will remain  in effect (meaning this test can be used) for the duration of the  Covid-19 declaration under Section 564(b)(1) of the Act, 21  U.S.C. section 360bbb-3(b)(1), unless the authorization is  terminated or revoked. Performed at [facility], [address] [place], [place] [address]   Blood culture (routine x 2)     Status: None (Preliminary result)   Collection Time: 04/08/20  8:51 PM   Specimen: BLOOD  Result Value Ref Range Status   Specimen Description BLOOD RIGHT [facility]  Final   Special Requests   Final    BOTTLES DRAWN AEROBIC AND ANAEROBIC Blood Culture adequate volume   Culture   Final    NO GROWTH < 12 HOURS Performed at  [facility], [address] [place], [place] [address]    Report Status PENDING  Incomplete  Blood culture (routine x 2)     Status: None (Preliminary result)   Collection Time: 04/08/20  8:51 PM   Specimen: BLOOD LEFT HAND  Result Value Ref Range Status   Specimen Description BLOOD LEFT HAND  Final   Special Requests   Final    BOTTLES DRAWN AEROBIC AND ANAEROBIC Blood Culture adequate volume   Culture   Final    NO GROWTH < 12 HOURS Performed at [facility], [address] [place], [place] [address]    Report Status PENDING  Incomplete  Expectorated sputum assessment w rflx to resp cult     Status: None   Collection Time: 04/09/20  3:14 AM   Specimen: Sputum  Result Value Ref Range Status   Specimen Description EXPECTORATED SPUTUM  Final   Special Requests Immunocompromised  Final   Sputum evaluation   Final    THIS SPECIMEN IS ACCEPTABLE FOR SPUTUM CULTURE Performed at [facility], [address], [place], [place] [address]    Report Status 04/09/2020 FINAL  Final    Procedures and diagnostic studies:  DG Chest 2 View  Result Date: 04/08/2020 CLINICAL DATA:  Hemoptysis.  Sore throat and sweating. EXAM: CHEST - 2 VIEW COMPARISON:  Report from chest radiograph 11/14/2003 FINDINGS: Abnormal opacities in the right chest including a 5.7 by 4.8 cm opacity in the right mid lung with central lucency, and a 5.2 by 3.2 cm right basilar opacity with central lucency. Possibilities may include cavitary pneumonia or malignancy. No blunting of the costophrenic angles. Cardiac and mediastinal margins appear normal. IMPRESSION: 1. Abnormal opacities with central lucencies in the right chest, differential diagnostic considerations include cavitary pneumonia or malignancy. Chest CT is recommended for further characterization. 2. No pleural effusion or airspace opacity observed. Electronically Signed   By: Gaylyn Rong M.D.   On: 04/08/2020 15:29   CT Angio Chest PE W/Cm &/Or Wo Cm  Result Date: 04/08/2020 CLINICAL DATA:  Cavitary lesions on recent chest x-ray EXAM: CT ANGIOGRAPHY CHEST WITH CONTRAST TECHNIQUE: Multidetector CT imaging of the chest was performed using the standard protocol during bolus administration of intravenous contrast. Multiplanar CT image reconstructions and MIPs were obtained to evaluate the vascular anatomy. CONTRAST:  81mL OMNIPAQUE IOHEXOL 350 MG/ML SOLN COMPARISON:  Chest x-ray from earlier in the same day. FINDINGS:  Cardiovascular: Thoracic aorta and its branches are within normal limits. No aneurysmal dilatation or dissection is seen. Pulmonary artery shows a normal branching pattern. No filling defect to suggest pulmonary embolism is seen. No coronary calcifications are noted Mediastinum/Nodes: Thoracic inlet is unremarkable. 9 mm short axis lymph node is noted in the right paratracheal region. 14 mm short axis subcarinal node is seen. Right hilar lymph nodes are noted. The largest of these measures 11 mm in short axis. The esophagus as visualized is within normal limits. Lungs/Pleura: Left lung is well aerated. No focal infiltrate or sizable effusion is seen. There are 2 cavitary lesions identified within the right lower lobe. The more lateral of these measures 3.9 x 3.6 cm. Air-fluid level is noted within. Slightly more superior and medial there is a 3.9 x 3.3 cm cavitary lesion with air-fluid level within. No sizable effusion or other focal parenchymal abnormality is noted. Upper Abdomen: Visualized upper abdomen shows fatty infiltration of the liver. Musculoskeletal: Degenerative changes of the thoracic spine are noted. No acute abnormality noted.  Review of the MIP images confirms the above findings. IMPRESSION: Cavitary lesions within the right lower lobe with reactive lymphadenopathy. These changes likely represent multifocal lung abscesses although the possibility of neoplasm cannot be totally excluded. Follow-up CT following appropriate therapy is recommended. Electronically Signed   By: Alcide Clever M.D.   On: 04/08/2020 18:14               LOS: 1 day   Anastyn Ayars  [facility]   Pager on [email or web address]. If 7PM-7AM, please contact night-coverage at [email or web address]     04/09/2020, 10:35 AM

## 2020-04-09 NOTE — Progress Notes (Signed)
*  PRELIMINARY RESULTS* Echocardiogram 2D Echocardiogram has been performed.  Tanner Berg 04/09/2020, 11:53 AM

## 2020-04-09 NOTE — Consult Note (Signed)
NAME: Tanner Berg  DOB: [birth year]  MRN: [id number]  Date/Time: 04/09/2020 8:45 AM  REQUESTING PROVIDER: Dr. Adela Glimpse Subjective:  REASON FOR CONSULT: Lung abscess ?History obtained from patient Tanner Berg is a 49 y.o. male with no significant past medical history is admitted with 10-day history of feeling unwell. Patient works as a Merchandiser, retail.  On Friday, 03/31/2019 when he was not feeling well.  He thought it was his allergies flaring up as he had a little cough and runny nose.  Over the weekend he was doing okay when he went to work on Monday.  He returned home feeling very tired and had to take the next 2 days off.  He had some cough and and brought up mucoid sputum and noted some spots of blood in it.  He returned to work on Thursday but was having some chest tightness coughing and and also was burning heart and took ibuprofen when he came home.  He did not return back to work.  He then started coughing up a lot more and noted blood in it which was according to him congealed and old blood. So he came to the hospital today. He has not taken any antibiotics recently Patient says he suffers from GERD and a month ago he took 2 Oreo cookies and milk before he went to bed and woke up at 3 AM choking and coughing.  He thinks he could have aspirated at that time. He also has poor dentition and in 2020 had tooth abscess and was given antibiotics. He is a current smoker but has not smoked since the past few days.  Usually takes influenza vaccine but has not taken it this year. He has not had Covid vaccine.  Past medical history: None Past surgical history none  Social History   Socioeconomic History  . Marital status: Married    Spouse name: Not on file  . Number of children: Not on file  . Years of education: Not on file  . Highest education level: Not on file  Occupational History  . Not on file  Tobacco Use  . Smoking status: Current Every Day Smoker    Packs/day: 1.00    Types:  Cigarettes  . Smokeless tobacco: Never Used  Substance and Sexual Activity  . Alcohol use: No  . Drug use: Not on file  . Sexual activity: Not on file  Other Topics Concern  . Not on file  Social History Narrative  . Not on file   Social Determinants of Health   Financial Resource Strain:   . Difficulty of Paying Living Expenses: Not on file  Food Insecurity:   . Worried About Programme researcher, broadcasting/film/video in the Last Year: Not on file  . Ran Out of Food in the Last Year: Not on file  Transportation Needs:   . Lack of Transportation (Medical): Not on file  . Lack of Transportation (Non-Medical): Not on file  Physical Activity:   . Days of Exercise per Week: Not on file  . Minutes of Exercise per Session: Not on file  Stress:   . Feeling of Stress : Not on file  Social Connections:   . Frequency of Communication with Friends and Family: Not on file  . Frequency of Social Gatherings with Friends and Family: Not on file  . Attends Religious Services: Not on file  . Active Member of Clubs or Organizations: Not on file  . Attends Banker Meetings: Not on file  . Marital Status: Not  on file  Intimate Partner Violence:   . Fear of Current or Ex-Partner: Not on file  . Emotionally Abused: Not on file  . Physically Abused: Not on file  . Sexually Abused: Not on file    Family History  Problem Relation Age of Onset  . Hypertension Other    No Known Allergies  ? Current Facility-Administered Medications  Medication Dose Route Frequency Provider Last Rate Last Admin  . 0.9 %  sodium chloride infusion   Intravenous Continuous Therisa Doyne, MD 100 mL/hr at 04/08/20 2351 New Bag at 04/08/20 2351  . acetaminophen (TYLENOL) tablet 650 mg  650 mg Oral Q6H PRN Therisa Doyne, MD   650 mg at 04/08/20 2215   Or  . acetaminophen (TYLENOL) suppository 650 mg  650 mg Rectal Q6H PRN Doutova, Anastassia, MD      . guaiFENesin-dextromethorphan (ROBITUSSIN DM) 100-10 MG/5ML  syrup 5 mL  5 mL Oral Q4H PRN Andris Baumann, MD   5 mL at 04/09/20 0548  . HYDROcodone-acetaminophen (NORCO/VICODIN) 5-325 MG per tablet 1-2 tablet  1-2 tablet Oral Q4H PRN Doutova, Anastassia, MD      . lactated ringers infusion   Intravenous Continuous Therisa Doyne, MD 150 mL/hr at 04/08/20 1900 New Bag at 04/08/20 1900  . nicotine (NICODERM CQ - dosed in mg/24 hours) patch 21 mg  21 mg Transdermal Daily Doutova, Anastassia, MD   21 mg at 04/08/20 2124  . piperacillin-tazobactam (ZOSYN) IVPB 3.375 g  3.375 g Intravenous Q8H Doutova, Anastassia, MD 12.5 mL/hr at 04/09/20 0517 3.375 g at 04/09/20 0517  . traZODone (DESYREL) tablet 50 mg  50 mg Oral QHS Doutova, Anastassia, MD      . vancomycin (VANCOCIN) IVPB 1000 mg/200 mL premix  1,000 mg Intravenous Q12H Katha Cabal, [facility]       Current Outpatient Medications  Medication Sig Dispense Refill  . ibuprofen (ADVIL) 200 MG tablet Take 600 mg by mouth every 6 (six) hours as needed.    . traZODone (DESYREL) 50 MG tablet Take 50 mg by mouth at bedtime.       Abtx:  Anti-infectives (From admission, onward)   Start     Dose/Rate Route Frequency Ordered Stop   04/09/20 0900  vancomycin (VANCOCIN) IVPB 1000 mg/200 mL premix        1,000 mg 200 mL/hr over 60 Minutes Intravenous Every 12 hours 04/08/20 2046     04/09/20 0600  ceFEPIme (MAXIPIME) 2 g in sodium chloride 0.9 % 100 mL IVPB  Status:  Discontinued        2 g 200 mL/hr over 30 Minutes Intravenous Every 8 hours 04/08/20 2046 04/08/20 2113   04/09/20 0500  piperacillin-tazobactam (ZOSYN) IVPB 3.375 g        3.375 g 12.5 mL/hr over 240 Minutes Intravenous Every 8 hours 04/08/20 2113     04/08/20 2100  vancomycin (VANCOREADY) IVPB 2000 mg/400 mL        2,000 mg 200 mL/hr over 120 Minutes Intravenous  Once 04/08/20 2036 04/09/20 0052   04/08/20 1845  vancomycin (VANCOCIN) IVPB 1000 mg/200 mL premix  Status:  Discontinued        1,000 mg 200 mL/hr over 60 Minutes Intravenous  Once  04/08/20 1834 04/08/20 2036   04/08/20 1845  ceFEPIme (MAXIPIME) 2 g in sodium chloride 0.9 % 100 mL IVPB        2 g 200 mL/hr over 30 Minutes Intravenous  Once 04/08/20 1834 04/08/20 2131  REVIEW OF SYSTEMS:  Const:  fever, sweats, maybe 3 pound weight loss.  Eyes: negative diplopia or visual changes, negative eye pain ENT: Has coryza, minimal sore throat Resp: Positive cough, hemoptysis, dyspnea Cards: Positive for chest pain, negative palpitations, lower extremity edema GU: negative for frequency, dysuria and hematuria GI: Negative for abdominal pain, diarrhea, bleeding, constipation Skin: negative for rash and pruritus Heme: negative for easy bruising and gum/nose bleeding MS: Generalized fatigue Neurolo:negative for headaches, dizziness, vertigo, memory problems  Psych: negative for feelings of anxiety, depression  Endocrine: negative for thyroid, diabetes Allergy/Immunology- negative for any medication or food allergies ?  Objective:  VITALS:  BP 107/60   Pulse 98   Temp 99.2 F (37.3 C) (Oral)   Resp 16   Ht 5\' 10"  (1.778 m)   Wt 77.1 kg   SpO2 94%   BMI 24.39 kg/m  PHYSICAL EXAM:  General: Alert, cooperative, no distress, appears stated age.  Head: Normocephalic, without obvious abnormality, atraumatic. Eyes: Conjunctivae clear, anicteric sclerae. Pupils are equal ENT Nares normal. No drainage or sinus tenderness. Lips, mucosa, and tongue normal. No Thrush Very poor dentition.  3 broken teeth remains Neck: Supple, symmetrical, no adenopathy, thyroid: non tender no carotid bruit and no JVD. Back: No CVA tenderness. Lungs: Bilateral air entry. Klebsiella right base. Heart: Regular rate and rhythm, no murmur, rub or gallop. Abdomen: Soft, non-tender,not distended. Bowel sounds normal. No masses Extremities: atraumatic, no cyanosis. No edema. No clubbing Skin: No rashes or lesions. Or bruising Lymph: Cervical, supraclavicular normal. Neurologic: Grossly  non-focal Pertinent Labs Lab Results CBC    Component Value Date/Time   WBC 18.2 (H) 04/08/2020 1319   WBC 17.3 (H) 04/08/2020 1319   RBC 4.76 04/08/2020 1319   RBC 4.59 04/08/2020 1319   HGB 14.5 04/08/2020 1319   HGB 14.1 04/08/2020 1319   HCT 41.7 04/08/2020 1319   HCT 41.2 04/08/2020 1319   PLT 381 04/08/2020 1319   PLT 419 (H) 04/08/2020 1319   MCV 87.6 04/08/2020 1319   MCV 89.8 04/08/2020 1319   MCH 30.5 04/08/2020 1319   MCH 30.7 04/08/2020 1319   MCHC 34.8 04/08/2020 1319   MCHC 34.2 04/08/2020 1319   RDW 12.4 04/08/2020 1319   RDW 12.5 04/08/2020 1319   LYMPHSABS 1.7 04/08/2020 1319   MONOABS 1.4 (H) 04/08/2020 1319   EOSABS 0.1 04/08/2020 1319   BASOSABS 0.0 04/08/2020 1319    CMP Latest Ref Rng & Units 04/09/2020 04/08/2020 10/05/2017  Glucose 70 - 99 mg/dL - 161(W121(H) 960(A146(H)  BUN 6 - 20 mg/dL - 14 13  Creatinine 5.400.61 - 1.24 mg/dL 9.810.79 1.910.94 4.781.04  Sodium 135 - 145 mmol/L - 135 138  Potassium 3.5 - 5.1 mmol/L - 3.7 3.9  Chloride 98 - 111 mmol/L - 99 104  CO2 22 - 32 mmol/L - 24 27  Calcium 8.9 - 10.3 mg/dL - 8.5(L) 9.0  Total Protein 6.5 - 8.1 g/dL - 8.5(H) -  Total Bilirubin 0.3 - 1.2 mg/dL - 0.8 -  Alkaline Phos 38 - 126 U/L - 86 -  AST 15 - 41 U/L - 28 -  ALT 0 - 44 U/L - 44 -      Microbiology: Recent Results (from the past 240 hour(s))  Respiratory Panel by RT PCR (Flu A&B, Covid) - Nasopharyngeal Swab     Status: None   Collection Time: 04/08/20  7:23 PM   Specimen: Nasopharyngeal Swab  Result Value Ref Range Status   SARS Coronavirus  2 by RT PCR NEGATIVE NEGATIVE Final    Comment: (NOTE) SARS-CoV-2 target nucleic acids are NOT DETECTED.  The SARS-CoV-2 RNA is generally detectable in upper respiratoy specimens during the acute phase of infection. The lowest concentration of SARS-CoV-2 viral copies this assay can detect is 131 copies/mL. A negative result does not preclude SARS-Cov-2 infection and should not be used as the sole basis for  treatment or other patient management decisions. A negative result may occur with  improper specimen collection/handling, submission of specimen other than nasopharyngeal swab, presence of viral mutation(s) within the areas targeted by this assay, and inadequate number of viral copies (<131 copies/mL). A negative result must be combined with clinical observations, patient history, and epidemiological information. The expected result is Negative.  Fact Sheet for Patients:  https://www.moore.com/  Fact Sheet for Healthcare Providers:  https://www.young.biz/  This test is no t yet approved or cleared by the [place] FDA and  has been authorized for detection and/or diagnosis of SARS-CoV-2 by FDA under an Emergency Use Authorization (EUA). This EUA will remain  in effect (meaning this test can be used) for the duration of the COVID-19 declaration under Section 564(b)(1) of the Act, 21 U.S.C. section 360bbb-3(b)(1), unless the authorization is terminated or revoked sooner.     Influenza A by PCR NEGATIVE NEGATIVE Final   Influenza B by PCR NEGATIVE NEGATIVE Final    Comment: (NOTE) The Xpert Xpress SARS-CoV-2/FLU/RSV assay is intended as an aid in  the diagnosis of influenza from Nasopharyngeal swab specimens and  should not be used as a sole basis for treatment. Nasal washings and  aspirates are unacceptable for Xpert Xpress SARS-CoV-2/FLU/RSV  testing.  Fact Sheet for Patients: https://www.moore.com/  Fact Sheet for Healthcare Providers: https://www.young.biz/  This test is not yet approved or cleared by the [place] FDA and  has been authorized for detection and/or diagnosis of SARS-CoV-2 by  FDA under an Emergency Use Authorization (EUA). This EUA will remain  in effect (meaning this test can be used) for the duration of the  Covid-19 declaration under Section 564(b)(1) of the Act, 21    U.S.C. section 360bbb-3(b)(1), unless the authorization is  terminated or revoked. Performed at [facility], [address] [place], [place] [address]   Blood culture (routine x 2)     Status: None (Preliminary result)   Collection Time: 04/08/20  8:51 PM   Specimen: BLOOD  Result Value Ref Range Status   Specimen Description BLOOD RIGHT [facility]  Final   Special Requests   Final    BOTTLES DRAWN AEROBIC AND ANAEROBIC Blood Culture adequate volume   Culture   Final    NO GROWTH < 12 HOURS Performed at [facility], [address], [place], [place] [address]    Report Status PENDING  Incomplete  Blood culture (routine x 2)     Status: None (Preliminary result)   Collection Time: 04/08/20  8:51 PM   Specimen: BLOOD LEFT HAND  Result Value Ref Range Status   Specimen Description BLOOD LEFT HAND  Final   Special Requests   Final    BOTTLES DRAWN AEROBIC AND ANAEROBIC Blood Culture adequate volume   Culture   Final    NO GROWTH < 12 HOURS Performed at [facility], [address] [place], [place] [address]    Report Status PENDING  Incomplete  Expectorated sputum assessment w rflx to resp cult     Status: None   Collection Time: 04/09/20  3:14 AM   Specimen: Sputum  Result Value Ref Range Status   Specimen Description EXPECTORATED SPUTUM  Final   Special Requests Immunocompromised  Final   Sputum evaluation   Final    THIS SPECIMEN IS ACCEPTABLE FOR SPUTUM CULTURE Performed at [facility], [address] [place], [place] [address]    Report Status 04/09/2020 FINAL  Final    IMAGING RESULTS:    I have personally reviewed the films ? Impression/Recommendation ? ?Lung abscesses.  2 noted on the right lobe thick-walled.  Likely etiology is aspiration of oral organisms leading to pneumonia with abscesses Lemierre's syndrome secondary to Fusobacterium leading to internal jugular vein thrombosis and septic emboli but it is less likely as  it is not bilateral Septic emboli from vegetations on the heart valves is less likely Nocardia can present like this but he is not immunocompromised in any way.  May consider a CT head as nocardia usually metastasis to the brain..  Less likely this is tuberculosis Sputum has been sent for AFB.  If this smear is negative will DC airborne isolation Sputum has been sent for culture Would recommend pulmonary consult as a bronchoscopy will be needed.   ? HIV nonreactive  SARS-CoV-2 negative  Discussed the management with the patient in great detail.  ___________________________________________________ Discussed with patient, requesting provider Note:  This document was prepared using Dragon voice recognition software and may include unintentional dictation errors.

## 2020-04-09 NOTE — ED Notes (Signed)
Assigned Floor reports unable to take pt d/t precautions  TB rule/out.   (pending quantiferon results).  Consulting civil engineer notified.

## 2020-04-09 NOTE — ED Notes (Signed)
Pt given food tray, lights dimmed per pt's request, call bell within reach, pt watching tv, bed locked low, rail up.

## 2020-04-10 ENCOUNTER — Encounter: Payer: Self-pay | Admitting: Internal Medicine

## 2020-04-10 DIAGNOSIS — J852 Abscess of lung without pneumonia: Secondary | ICD-10-CM | POA: Diagnosis not present

## 2020-04-10 LAB — LEGIONELLA PNEUMOPHILA SEROGP 1 UR AG: L. pneumophila Serogp 1 Ur Ag: NEGATIVE

## 2020-04-10 LAB — PROCALCITONIN: Procalcitonin: <0.1 ng/mL

## 2020-04-10 MED ORDER — LIDOCAINE HCL URETHRAL/MUCOSAL 2 % EX GEL
1.0000 "application " | Freq: Once | CUTANEOUS | Status: DC
  Filled 2020-04-10: qty 5

## 2020-04-10 MED ORDER — BUTAMBEN-TETRACAINE-BENZOCAINE 2-2-14 % EX AERO
1.0000 | INHALATION_SPRAY | Freq: Once | CUTANEOUS | Status: DC
  Filled 2020-04-10: qty 20

## 2020-04-10 MED ORDER — SODIUM CHLORIDE 0.9 % IV SOLN
INTRAVENOUS | Status: DC | PRN
  Administered 2020-04-10: 250 mL via INTRAVENOUS

## 2020-04-10 MED ORDER — PHENYLEPHRINE HCL 0.25 % NA SOLN
1.0000 | Freq: Four times a day (QID) | NASAL | Status: DC | PRN
  Filled 2020-04-10: qty 15

## 2020-04-10 NOTE — Anesthesia Preprocedure Evaluation (Addendum)
Anesthesia Evaluation  Patient identified by MRN, date of birth, ID band Patient awake    Reviewed: Allergy & Precautions, NPO status , Patient's Chart, lab work & pertinent test results  History of Anesthesia Complications Negative for: history of anesthetic complications  Airway Mallampati: III  TM Distance: >3 FB Neck ROM: Full    Dental  (+) Poor Dentition, Missing, Chipped   Pulmonary neg sleep apnea, pneumonia, unresolved, neg COPD, Current Smoker and Patient abstained from smoking.,    Pulmonary exam normal  + decreased breath sounds      Cardiovascular Exercise Tolerance: Good METS(-) hypertension(-) CAD and (-) Past MI negative cardio ROS  (-) dysrhythmias  Rhythm:Regular Rate:Normal - Systolic murmurs    Neuro/Psych negative neurological ROS  negative psych ROS   GI/Hepatic neg GERD  ,(+)     (-) substance abuse  ,   Endo/Other  neg diabetes  Renal/GU negative Renal ROS     Musculoskeletal   Abdominal   Peds  Hematology   Anesthesia Other Findings 49 y.o. male with medical history significant for COVID-19 infection in February 2021, history of dental abscess, GERD, presented to the hospital with 4-day history of cough, hemoptysis, sore throat and night sweats.  He said he aspirated at night about 2 weeks prior to admission after he drank milk and cookies shortly before bedtime.   Reproductive/Obstetrics                            Anesthesia Physical Anesthesia Plan  ASA: III  Anesthesia Plan: General   Post-op Pain Management:    Induction: Intravenous  PONV Risk Score and Plan: 1 and Ondansetron, Dexamethasone and Midazolam  Airway Management Planned: Oral ETT  Additional Equipment: None  Intra-op Plan:   Post-operative Plan: Extubation in OR  Informed Consent: I have reviewed the patients History and Physical, chart, labs and discussed the procedure including  the risks, benefits and alternatives for the proposed anesthesia with the patient or authorized representative who has indicated his/her understanding and acceptance.     Dental advisory given  Plan Discussed with: CRNA and Surgeon  Anesthesia Plan Comments: (Discussed risks of anesthesia with patient, including PONV, sore throat, lip/dental damage. Rare risks discussed as well, such as cardiorespiratory and neurological sequelae. Patient understands.  Patient evaluated in the OR due to airborne precautions. preop eval completed prior to anesthesia induction)        Anesthesia Quick Evaluation

## 2020-04-10 NOTE — Progress Notes (Signed)
Progress Note    Tanner Berg  GGY:[id number] DOB: 19-Aug-1970  DOA: 04/08/2020 PCP: Titus Mould, NP      Brief Narrative:    Medical records reviewed and are as summarized below:  Tanner Berg is a 49 y.o. male with medical history significant for COVID-19 infection in February 2021, history of dental abscess, GERD, presented to the hospital with 4-day history of cough, hemoptysis, sore throat and night sweats.  He said he aspirated at night about 2 weeks prior to admission after he drank milk and cookies shortly before bedtime.  He was found to have right lower lobe cavitary lesions concerning for multifocal lung abscess.  He was treated with empiric IV antibiotics.  ID and pulmonologist have been consulted to assist with management.      Assessment/Plan:   Principal Problem:   Cavitary pneumonia Active Problems:   Hemoptysis   Tobacco abuse   History of COVID-19   Dehydration   Body mass index is 25.24 kg/m.    PLAN  Fever/ right lower lobe cavitary pneumonia/multifocal abscess/hemoptysis: Continue IV Zosyn. Follow-up sputum AFB, sputum and blood cultures. Consulted pulmonologist, Dr. Karna Christmas, today for bronchoscopy. Follow-up with ID.  Continue airborne isolation pending sputum AFB.  Dehydration: Improved.  IV fluids have been discontinued.  Insomnia: Trazodone as needed for sleep.  Tobacco use disorder: Continue nicotine patch  Diet Order            Diet Heart Room service appropriate? Yes; Fluid consistency: Thin  Diet effective now                    Consultants:  Infectious disease  Pulmonologist  Procedures:  None    Medications:   . nicotine  21 mg Transdermal Daily  . traZODone  25 mg Oral QHS   Continuous Infusions: . sodium chloride 250 mL (04/10/20 0547)  . piperacillin-tazobactam (ZOSYN)  IV 3.375 g (04/10/20 1345)     Anti-infectives (From admission, onward)   Start     Dose/Rate Route Frequency  Ordered Stop   04/09/20 0900  vancomycin (VANCOCIN) IVPB 1000 mg/200 mL premix  Status:  Discontinued        1,000 mg 200 mL/hr over 60 Minutes Intravenous Every 12 hours 04/08/20 2046 04/09/20 1831   04/09/20 0600  ceFEPIme (MAXIPIME) 2 g in sodium chloride 0.9 % 100 mL IVPB  Status:  Discontinued        2 g 200 mL/hr over 30 Minutes Intravenous Every 8 hours 04/08/20 2046 04/08/20 2113   04/09/20 0500  piperacillin-tazobactam (ZOSYN) IVPB 3.375 g        3.375 g 12.5 mL/hr over 240 Minutes Intravenous Every 8 hours 04/08/20 2113     04/08/20 2100  vancomycin (VANCOREADY) IVPB 2000 mg/400 mL        2,000 mg 200 mL/hr over 120 Minutes Intravenous  Once 04/08/20 2036 04/09/20 0052   04/08/20 1845  vancomycin (VANCOCIN) IVPB 1000 mg/200 mL premix  Status:  Discontinued        1,000 mg 200 mL/hr over 60 Minutes Intravenous  Once 04/08/20 1834 04/08/20 2036   04/08/20 1845  ceFEPIme (MAXIPIME) 2 g in sodium chloride 0.9 % 100 mL IVPB        2 g 200 mL/hr over 30 Minutes Intravenous  Once 04/08/20 1834 04/08/20 2131             Family Communication/Anticipated D/C date and plan/Code Status   DVT prophylaxis: SCDs Start:  04/08/20 2021     Code Status: Full Code  Family Communication: None Disposition Plan:    Status is: Inpatient  Remains inpatient appropriate because:IV treatments appropriate due to intensity of illness or inability to take PO   Dispo: The patient is from: Home              Anticipated d/c is to: Home              Anticipated d/c date is: > 3 days              Patient currently is not medically stable to d/c.           Subjective:   He complains of cough productive of brownish sputum. He had a fever this morning with T-max of 102.9  Objective:    Vitals:   04/10/20 0651 04/10/20 0842 04/10/20 1000 04/10/20 1245  BP: 118/66 109/67 105/60 127/75  Pulse: (!) 101 96 85 91  Resp: 16 17 (!) 21 18  Temp: 98.6 F (37 C) 98.5 F (36.9 C)   99.2 F (37.3 C)  TempSrc: Oral Oral  Oral  SpO2: 94% 97% 95% 98%  Weight:      Height:       No data found.   Intake/Output Summary (Last 24 hours) at 04/10/2020 1438 Last data filed at 04/10/2020 0844 Gross per 24 hour  Intake 90.66 ml  Output 850 ml  Net -759.34 ml   Filed Weights   04/08/20 1312 04/10/20 0141  Weight: 77.1 kg 82.1 kg    Exam:   GEN: NAD SKIN: No rash EYES: No pallor or icterus ENT: MMM CV: RRR PULM: CTA B ABD: soft, ND, NT, +BS CNS: AAO x 3, non focal EXT: No edema or tenderness      Data Reviewed:   I have personally reviewed following labs and imaging studies:  Labs: Labs show the following:   Basic Metabolic Panel: Recent Labs  Lab 04/08/20 1319 04/08/20 2051 04/09/20 0527  NA 135  --   --   K 3.7  --   --   CL 99  --   --   CO2 24  --   --   GLUCOSE 121*  --   --   BUN 14  --   --   CREATININE 0.94  --  0.79  CALCIUM 8.5*  --   --   MG  --  2.1  --   PHOS  --   --  2.7   GFR Estimated Creatinine Clearance: 119 mL/min (by C-G formula based on SCr of 0.79 mg/dL). Liver Function Tests: Recent Labs  Lab 04/08/20 1924  AST 28  ALT 44  ALKPHOS 86  BILITOT 0.8  PROT 8.5*  ALBUMIN 3.7   No results for input(s): LIPASE, AMYLASE in the last 168 hours. No results for input(s): AMMONIA in the last 168 hours. Coagulation profile Recent Labs  Lab 04/08/20 2051  INR 1.1    CBC: Recent Labs  Lab 04/08/20 1319  WBC 17.3*  18.2*  NEUTROABS 14.0*  HGB 14.1  14.5  HCT 41.2  41.7  MCV 89.8  87.6  PLT 419*  381   Cardiac Enzymes: Recent Labs  Lab 04/08/20 2051  CKTOTAL 101   BNP (last 3 results) No results for input(s): PROBNP in the last 8760 hours. CBG: No results for input(s): GLUCAP in the last 168 hours. D-Dimer: No results for input(s): DDIMER in the last 72 hours. Hgb  A1c: No results for input(s): HGBA1C in the last 72 hours. Lipid Profile: No results for input(s): CHOL, HDL, LDLCALC, TRIG,  CHOLHDL, LDLDIRECT in the last 72 hours. Thyroid function studies: Recent Labs    04/09/20 0527  TSH 1.077   Anemia work up: No results for input(s): VITAMINB12, FOLATE, FERRITIN, TIBC, IRON, RETICCTPCT in the last 72 hours. Sepsis Labs: Recent Labs  Lab 04/08/20 1319 04/08/20 1924 04/08/20 2051 04/09/20 0527 04/10/20 0707  PROCALCITON  --  <0.10  --  <0.10 <0.10  WBC 17.3*  18.2*  --   --   --   --   LATICACIDVEN  --  1.3 1.2  --   --     Microbiology Recent Results (from the past 240 hour(s))  Respiratory Panel by RT PCR (Flu A&B, Covid) - Nasopharyngeal Swab     Status: None   Collection Time: 04/08/20  7:23 PM   Specimen: Nasopharyngeal Swab  Result Value Ref Range Status   SARS Coronavirus 2 by RT PCR NEGATIVE NEGATIVE Final    Comment: (NOTE) SARS-CoV-2 target nucleic acids are NOT DETECTED.  The SARS-CoV-2 RNA is generally detectable in upper respiratoy specimens during the acute phase of infection. The lowest concentration of SARS-CoV-2 viral copies this assay can detect is 131 copies/mL. A negative result does not preclude SARS-Cov-2 infection and should not be used as the sole basis for treatment or other patient management decisions. A negative result may occur with  improper specimen collection/handling, submission of specimen other than nasopharyngeal swab, presence of viral mutation(s) within the areas targeted by this assay, and inadequate number of viral copies (<131 copies/mL). A negative result must be combined with clinical observations, patient history, and epidemiological information. The expected result is Negative.  Fact Sheet for Patients:  https://www.moore.com/  Fact Sheet for Healthcare Providers:  https://www.young.biz/  This test is no t yet approved or cleared by the [place] FDA and  has been authorized for detection and/or diagnosis of SARS-CoV-2 by FDA under an Emergency Use Authorization  (EUA). This EUA will remain  in effect (meaning this test can be used) for the duration of the COVID-19 declaration under Section 564(b)(1) of the Act, 21 U.S.C. section 360bbb-3(b)(1), unless the authorization is terminated or revoked sooner.     Influenza A by PCR NEGATIVE NEGATIVE Final   Influenza B by PCR NEGATIVE NEGATIVE Final    Comment: (NOTE) The Xpert Xpress SARS-CoV-2/FLU/RSV assay is intended as an aid in  the diagnosis of influenza from Nasopharyngeal swab specimens and  should not be used as a sole basis for treatment. Nasal washings and  aspirates are unacceptable for Xpert Xpress SARS-CoV-2/FLU/RSV  testing.  Fact Sheet for Patients: https://www.moore.com/  Fact Sheet for Healthcare Providers: https://www.young.biz/  This test is not yet approved or cleared by the [place] FDA and  has been authorized for detection and/or diagnosis of SARS-CoV-2 by  FDA under an Emergency Use Authorization (EUA). This EUA will remain  in effect (meaning this test can be used) for the duration of the  Covid-19 declaration under Section 564(b)(1) of the Act, 21  U.S.C. section 360bbb-3(b)(1), unless the authorization is  terminated or revoked. Performed at [facility], [address] [place], [place] [address]   Blood culture (routine x 2)     Status: None (Preliminary result)   Collection Time: 04/08/20  8:51 PM   Specimen: BLOOD  Result Value Ref Range Status   Specimen Description BLOOD RIGHT [facility]  Final  Special Requests   Final    BOTTLES DRAWN AEROBIC AND ANAEROBIC Blood Culture adequate volume   Culture   Final    NO GROWTH 2 DAYS Performed at [facility], [address] [place], [place] [address]    Report Status PENDING  Incomplete  Blood culture (routine x 2)     Status: None (Preliminary result)   Collection Time: 04/08/20  8:51 PM   Specimen: BLOOD LEFT HAND  Result Value Ref Range Status    Specimen Description BLOOD LEFT HAND  Final   Special Requests   Final    BOTTLES DRAWN AEROBIC AND ANAEROBIC Blood Culture adequate volume   Culture   Final    NO GROWTH 2 DAYS Performed at [facility], [address] [place], [place] [address]    Report Status PENDING  Incomplete  MRSA PCR Screening     Status: None   Collection Time: 04/08/20 10:07 PM   Specimen: Nasal Mucosa; Nasopharyngeal  Result Value Ref Range Status   MRSA by PCR NEGATIVE NEGATIVE Final    Comment:        The GeneXpert MRSA Assay (FDA approved for NASAL specimens only), is one component of a comprehensive MRSA colonization surveillance program. It is not intended to diagnose MRSA infection nor to guide or monitor treatment for MRSA infections. Performed at [facility], [address] [place], [place] [address]   Expectorated sputum assessment w rflx to resp cult     Status: None   Collection Time: 04/09/20  3:14 AM   Specimen: Sputum  Result Value Ref Range Status   Specimen Description EXPECTORATED SPUTUM  Final   Special Requests Immunocompromised  Final   Sputum evaluation   Final    THIS SPECIMEN IS ACCEPTABLE FOR SPUTUM CULTURE Performed at [facility], [address], [place], [place] [address]    Report Status 04/09/2020 FINAL  Final  Culture, respiratory     Status: None (Preliminary result)   Collection Time: 04/09/20  3:14 AM  Result Value Ref Range Status   Specimen Description   Final    EXPECTORATED SPUTUM Performed at [facility], [address], [place], [place] [address]    Special Requests   Final    Immunocompromised Reflexed from [phone] Performed at [facility], [address] [place], [place] [address]    Gram Stain   Final    FEW SQUAMOUS EPITHELIAL CELLS PRESENT MODERATE WBC PRESENT,BOTH PMN AND MONONUCLEAR FEW GRAM POSITIVE COCCI FEW GRAM NEGATIVE RODS    Culture   Final    CULTURE REINCUBATED FOR BETTER  GROWTH Performed at [facility], [address], [place], [place] [address]    Report Status PENDING  Incomplete    Procedures and diagnostic studies:  DG Chest 2 View  Result Date: 04/08/2020 CLINICAL DATA:  Hemoptysis.  Sore throat and sweating. EXAM: CHEST - 2 VIEW COMPARISON:  Report from chest radiograph 11/14/2003 FINDINGS: Abnormal opacities in the right chest including a 5.7 by 4.8 cm opacity in the right mid lung with central lucency, and a 5.2 by 3.2 cm right basilar opacity with central lucency. Possibilities may include cavitary pneumonia or malignancy. No blunting of the costophrenic angles. Cardiac and mediastinal margins appear normal. IMPRESSION: 1. Abnormal opacities with central lucencies in the right chest, differential diagnostic considerations include cavitary pneumonia or malignancy. Chest CT is recommended for further characterization. 2. No pleural effusion or airspace opacity observed. Electronically Signed  By: Gaylyn Rong M.D.   On: 04/08/2020 15:29   CT Angio Chest PE W/Cm &/Or Wo Cm  Result Date: 04/08/2020 CLINICAL DATA:  Cavitary lesions on recent chest x-ray EXAM: CT ANGIOGRAPHY CHEST WITH CONTRAST TECHNIQUE: Multidetector CT imaging of the chest was performed using the standard protocol during bolus administration of intravenous contrast. Multiplanar CT image reconstructions and MIPs were obtained to evaluate the vascular anatomy. CONTRAST:  75mL OMNIPAQUE IOHEXOL 350 MG/ML SOLN COMPARISON:  Chest x-ray from earlier in the same day. FINDINGS: Cardiovascular: Thoracic aorta and its branches are within normal limits. No aneurysmal dilatation or dissection is seen. Pulmonary artery shows a normal branching pattern. No filling defect to suggest pulmonary embolism is seen. No coronary calcifications are noted Mediastinum/Nodes: Thoracic inlet is unremarkable. 9 mm short axis lymph node is noted in the right paratracheal region. 14 mm short axis subcarinal  node is seen. Right hilar lymph nodes are noted. The largest of these measures 11 mm in short axis. The esophagus as visualized is within normal limits. Lungs/Pleura: Left lung is well aerated. No focal infiltrate or sizable effusion is seen. There are 2 cavitary lesions identified within the right lower lobe. The more lateral of these measures 3.9 x 3.6 cm. Air-fluid level is noted within. Slightly more superior and medial there is a 3.9 x 3.3 cm cavitary lesion with air-fluid level within. No sizable effusion or other focal parenchymal abnormality is noted. Upper Abdomen: Visualized upper abdomen shows fatty infiltration of the liver. Musculoskeletal: Degenerative changes of the thoracic spine are noted. No acute abnormality noted. Review of the MIP images confirms the above findings. IMPRESSION: Cavitary lesions within the right lower lobe with reactive lymphadenopathy. These changes likely represent multifocal lung abscesses although the possibility of neoplasm cannot be totally excluded. Follow-up CT following appropriate therapy is recommended. Electronically Signed   By: Alcide Clever M.D.   On: 04/08/2020 18:14   ECHOCARDIOGRAM COMPLETE  Result Date: 04/09/2020    ECHOCARDIOGRAM REPORT   Patient Name:   Tanner Berg Date of Exam: 04/09/2020 Medical Rec #:  [id number]   Height:       70.0 in Accession #:    [id number]  Weight:       170.0 lb Date of Birth:  06/21/1971   BSA:          1.948 m Patient Age:    49 years    BP:           104/65 mmHg Patient Gender: M           HR:           83 bpm. Exam Location:  [facility] Procedure: 2D Echo, Color Doppler and Cardiac Doppler Indications:     R50.9 Fever  History:         Patient has no prior history of Echocardiogram examinations. Pt                  tested positive for COVID-19 on 08/10/19.  Sonographer:     Humphrey Rolls RDCS (AE) Referring Phys:  Kenn File DOUTOVA Diagnosing Phys: Lorine Bears MD IMPRESSIONS  1. Left ventricular ejection fraction, by  estimation, is 55 to 60%. The left ventricle has normal function. The left ventricle has no regional wall motion abnormalities. Left ventricular diastolic parameters were normal.  2. Right ventricular systolic function is normal. The right ventricular size is normal. Tricuspid regurgitation signal is inadequate for assessing PA pressure.  3. The mitral valve is normal  in structure. No evidence of mitral valve regurgitation. No evidence of mitral stenosis.  4. The aortic valve is normal in structure. Aortic valve regurgitation is not visualized. No aortic stenosis is present.  5. The inferior vena cava is normal in size with greater than 50% respiratory variability, suggesting right atrial pressure of 3 mmHg. Conclusion(s)/Recommendation(s): No evidence of valvular vegetations on this transthoracic echocardiogram. FINDINGS  Left Ventricle: Left ventricular ejection fraction, by estimation, is 55 to 60%. The left ventricle has normal function. The left ventricle has no regional wall motion abnormalities. The left ventricular internal cavity size was normal in size. There is  no left ventricular hypertrophy. Left ventricular diastolic parameters were normal. Right Ventricle: The right ventricular size is normal. No increase in right ventricular wall thickness. Right ventricular systolic function is normal. Tricuspid regurgitation signal is inadequate for assessing PA pressure. Left Atrium: Left atrial size was normal in size. Right Atrium: Right atrial size was normal in size. Pericardium: There is no evidence of pericardial effusion. Mitral Valve: The mitral valve is normal in structure. No evidence of mitral valve regurgitation. No evidence of mitral valve stenosis. MV peak gradient, 5.1 mmHg. The mean mitral valve gradient is 3.0 mmHg. Tricuspid Valve: The tricuspid valve is normal in structure. Tricuspid valve regurgitation is trivial. No evidence of tricuspid stenosis. Aortic Valve: The aortic valve is normal in  structure. Aortic valve regurgitation is not visualized. No aortic stenosis is present. Aortic valve mean gradient measures 5.0 mmHg. Aortic valve peak gradient measures 10.5 mmHg. Aortic valve area, by VTI measures 2.42 cm. Pulmonic Valve: The pulmonic valve was normal in structure. Pulmonic valve regurgitation is not visualized. No evidence of pulmonic stenosis. Aorta: The aortic root is normal in size and structure. Venous: The inferior vena cava is normal in size with greater than 50% respiratory variability, suggesting right atrial pressure of 3 mmHg. IAS/Shunts: No atrial level shunt detected by color flow Doppler.  LEFT VENTRICLE PLAX 2D LVIDd:         4.66 cm  Diastology LVIDs:         3.43 cm  LV e' medial:    8.70 cm/s LV PW:         0.91 cm  LV E/e' medial:  10.7 LV IVS:        0.77 cm  LV e' lateral:   13.60 cm/s LVOT diam:     2.10 cm  LV E/e' lateral: 6.8 LV SV:         65 LV SV Index:   34 LVOT Area:     3.46 cm  RIGHT VENTRICLE RV Basal diam:  2.57 cm LEFT ATRIUM             Index       RIGHT ATRIUM           Index LA diam:        2.70 cm 1.39 cm/m  RA Area:     10.90 cm LA Vol (A2C):   33.4 ml 17.15 ml/m RA Volume:   21.60 ml  11.09 ml/m LA Vol (A4C):   21.3 ml 10.93 ml/m LA Biplane Vol: 28.6 ml 14.68 ml/m  AORTIC VALVE                    PULMONIC VALVE AV Area (Vmax):    2.52 cm     PV Vmax:       1.22 m/s AV Area (Vmean):   2.32 cm  PV Vmean:      81.900 cm/s AV Area (VTI):     2.42 cm     PV VTI:        0.237 m AV Vmax:           162.00 cm/s  PV Peak grad:  6.0 mmHg AV Vmean:          105.000 cm/s PV Mean grad:  3.0 mmHg AV VTI:            0.270 m AV Peak Grad:      10.5 mmHg AV Mean Grad:      5.0 mmHg LVOT Vmax:         118.00 cm/s LVOT Vmean:        70.400 cm/s LVOT VTI:          0.189 m LVOT/AV VTI ratio: 0.70  AORTA Ao Root diam: 3.00 cm MITRAL VALVE MV Area (PHT): 5.54 cm    SHUNTS MV Peak grad:  5.1 mmHg    Systemic VTI:  0.19 m MV Mean grad:  3.0 mmHg    Systemic Diam: 2.10  cm MV Vmax:       1.13 m/s MV Vmean:      76.8 cm/s MV Decel Time: 137 msec MV E velocity: 93.00 cm/s MV A velocity: 74.60 cm/s MV E/A ratio:  1.25 Lorine Bears MD Electronically signed by Lorine Bears MD Signature Date/Time: 04/09/2020/12:37:37 PM    Final                LOS: 2 days   Stephania Macfarlane  [facility]   Pager on [email or web address]. If 7PM-7AM, please contact night-coverage at [email or web address]     04/10/2020, 2:38 PM

## 2020-04-10 NOTE — ED Notes (Signed)
Thorough report given to RN on floor (2A). Patient verbalized understanding of admission. Patient transported via wheelchair with all belongings taken.

## 2020-04-10 NOTE — Progress Notes (Signed)
ID Pt had a fever of 102.9 this morning Was feeling very cold After getting Tylenol he sweat a lot Coughing up phlegm but less  Patient Vitals for the past 24 hrs:  BP Temp Temp src Pulse Resp SpO2 Height Weight  04/10/20 1245 127/75 99.2 F (37.3 C) Oral 91 18 98 % -- --  04/10/20 1000 105/60 -- -- 85 (!) 21 95 % -- --  04/10/20 0842 109/67 98.5 F (36.9 C) Oral 96 17 97 % -- --  04/10/20 0651 118/66 98.6 F (37 C) Oral (!) 101 16 94 % -- --  04/10/20 0601 124/63 (!) 100.9 F (38.3 C) Oral (!) 108 18 94 % -- --  04/10/20 0534 124/65 (!) 102.9 F (39.4 C) Oral (!) 116 -- 93 % -- --  04/10/20 0141 127/75 98.1 F (36.7 C) Oral 78 -- 99 % 5\' 11"  (1.803 m) 82.1 kg  04/10/20 0105 113/68 98.6 F (37 C) Oral 69 (!) 23 94 % -- --  04/09/20 2230 (!) 153/80 -- -- (!) 103 14 96 % -- --  04/09/20 2215 -- -- -- (!) 108 -- -- -- --  04/09/20 2200 (!) 160/89 -- -- -- 13 96 % -- --  04/09/20 2145 -- -- -- 90 -- -- -- --  04/09/20 2130 109/66 -- -- 92 (!) 22 -- -- --  04/09/20 2115 -- -- -- -- -- 95 % -- --  04/09/20 2100 125/69 -- -- 91 19 98 % -- --  04/09/20 2030 122/77 -- -- 92 -- 95 % -- --  04/09/20 2015 -- -- -- -- 20 -- -- --  04/09/20 2000 118/66 -- -- -- -- -- -- --  04/09/20 1934 -- -- -- 98 17 97 % -- --  04/09/20 1830 134/68 -- -- 98 18 97 % -- --  04/09/20 1608 108/66 98.5 F (36.9 C) Oral 94 18 96 % -- --    O/e awake and alert Poor dentition Chest b/l air entry crepts , rhonchi rt base [id number] abd soft Cns non focal   Labs CBC Latest Ref Rng & Units 04/08/2020 04/08/2020 10/05/2017  WBC 4.0 - 10.5 K/uL 17.3(H) 18.2(H) 10.9(H)  Hemoglobin 13.0 - 17.0 g/dL 10/07/2017 96.7 59.1  Hematocrit 39 - 52 % 41.2 41.7 47.2  Platelets 150 - 400 K/uL 419(H) 381 276    CMP Latest Ref Rng & Units 04/09/2020 04/08/2020 10/05/2017  Glucose 70 - 99 mg/dL - 10/07/2017) 466(Z)  BUN 6 - 20 mg/dL - 14 13  Creatinine 993(T - 1.24 mg/dL 7.01 7.79 3.90  Sodium 135 - 145 mmol/L - 135 138  Potassium  3.5 - 5.1 mmol/L - 3.7 3.9  Chloride 98 - 111 mmol/L - 99 104  CO2 22 - 32 mmol/L - 24 27  Calcium 8.9 - 10.3 mg/dL - 8.5(L) 9.0  Total Protein 6.5 - 8.1 g/dL - 8.5(H) -  Total Bilirubin 0.3 - 1.2 mg/dL - 0.8 -  Alkaline Phos 38 - 126 U/L - 86 -  AST 15 - 41 U/L - 28 -  ALT 0 - 44 U/L - 44 -    Impression/recommendation  Lung abscesses.  2 noted on the right lobe thick-walled.  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  May consider a CT head as nocardia usually metastasis to the brain..  Less likely this is tuberculosis Sputum has been sent for AFB.  If this smear is negative will DC airborne isolation Sputum has been sent for culture Appreciate pulmonary consult - bronch planned ? HIV nonreactive Continue zosyn SARS-CoV-2 negative  Discussed the management with the patient and care team.

## 2020-04-10 NOTE — Plan of Care (Signed)
  Problem: Clinical Measurements: Goal: Respiratory complications will improve Outcome: Progressing Goal: Cardiovascular complication will be avoided Outcome: Progressing   Problem: Activity: Goal: Risk for activity intolerance will decrease Outcome: Progressing   Problem: Activity: Goal: Ability to tolerate increased activity will improve Outcome: Progressing   Problem: Clinical Measurements: Goal: Ability to maintain a body temperature in the normal range will improve Outcome: Progressing   Problem: Respiratory: Goal: Ability to maintain adequate ventilation will improve Outcome: Progressing Goal: Ability to maintain a clear airway will improve Outcome: Progressing

## 2020-04-10 NOTE — Progress Notes (Signed)
   04/10/20 0601  Assess: MEWS Score  Temp (!) 100.9 F (38.3 C)  BP 124/63  Pulse Rate (!) 108  ECG Heart Rate (!) 108  Resp 18  Level of Consciousness Alert  SpO2 94 %  O2 Device Room Air  Assess: MEWS Score  MEWS Temp 1  MEWS Systolic 0  MEWS Pulse 1  MEWS RR 0  MEWS LOC 0  MEWS Score 2  MEWS Score Color Yellow  Assess: if the MEWS score is Yellow or Red  Were vital signs taken at a resting state? Yes  Focused Assessment Change from prior assessment (see assessment flowsheet)  Early Detection of Sepsis Score *See Row Information* Low  MEWS guidelines implemented *See Row Information* Yes  Treat  MEWS Interventions Administered prn meds/treatments  Pain Score 0  Take Vital Signs  Increase Vital Sign Frequency  Yellow: Q 2hr X 2 then Q 4hr X 2, if remains yellow, continue Q 4hrs  Escalate  MEWS: Escalate Yellow: discuss with charge nurse/RN and consider discussing with provider and RRT  Notify: Charge Nurse/RN  Name of Charge Nurse/RN Notified Alisa  Date Charge Nurse/RN Notified 04/10/20  Time Charge Nurse/RN Notified [phone]

## 2020-04-10 NOTE — Consult Note (Signed)
Pulmonary Medicine          Date: 04/10/2020,   MRN# [id number] Tanner Berg 17-Nov-1970     AdmissionWeight: 77.1 kg                 CurrentWeight: 82.1 kg  Referring physician: Dr Myriam Forehand    CHIEF COMPLAINT:   Multiple cavitary lesions of right lung with hilar and mediastinal lymphadenopathy   HISTORY OF PRESENT ILLNESS   49 M with PMH as below who came in after sub-acute course of cough with recurrent non-massive hemoptysis. Reports subjective fevers but no specific sick contacts , does admit to malaise, and flu like illness. Had COVID19 07/2019. Returned to ED due to recurrent cough with blood streaked expectorate. Patient found to have leukocytosis and pneumonia on initial screening CXR in ED. He was admitted to hospitalist [facility] service with empiric therapy for CAP. He was evaluated by ID specialist. He had respiratory cultures done with mixed flora growing on background on inflammatory cells.  Follow up CT chest imaging was done showing right lower lobe thick walled cavitary lesion with air-fluid level consistent with abscess.  Patient does have poor dentition and does admit to regurgitation with GERD hx. He is still febrile and has been treated with IV Zosyn however seems to have very little appreciable improvement.  Case discussed with infectious disease specialist who raises concern regarding poor response to therapy with subsequent request for bronchoscopic evaluation and invasive specimen to rule out other etiologies such as maignancy , rare indolent infections such as nocardia, fungal etiology, vasculiltides.  Family hx of cancer includes prostate cancer on both sides.  Denies hx of autoimmune disease hx, has hx of DM family hx.  He takes asprin last pill 1 wk ago. He actively smokes 1/2 pack daily.    PAST MEDICAL HISTORY   No past medical history on file.   SURGICAL HISTORY   History reviewed. No pertinent surgical history.   FAMILY HISTORY   Family History    Problem Relation Age of Onset  . Hypertension Other      SOCIAL HISTORY   Social History   Tobacco Use  . Smoking status: Current Every Day Smoker    Packs/day: 1.00    Types: Cigarettes  . Smokeless tobacco: Never Used  Vaping Use  . Vaping Use: Never used  Substance Use Topics  . Alcohol use: No  . Drug use: Never     MEDICATIONS    Home Medication:    Current Medication:  Current Facility-Administered Medications:  .  0.9 %  sodium chloride infusion, , Intravenous, PRN, Doutova, Anastassia, MD, Last Rate: 10 mL/hr at 04/10/20 0547, 250 mL at 04/10/20 0547 .  acetaminophen (TYLENOL) tablet 650 mg, 650 mg, Oral, Q6H PRN, 650 mg at 04/10/20 0545 **OR** acetaminophen (TYLENOL) suppository 650 mg, 650 mg, Rectal, Q6H PRN, Doutova, Anastassia, MD .  guaiFENesin-dextromethorphan (ROBITUSSIN DM) 100-10 MG/5ML syrup 5 mL, 5 mL, Oral, Q4H PRN, Andris Baumann, MD, 5 mL at 04/09/20 0548 .  HYDROcodone-acetaminophen (NORCO/VICODIN) 5-325 MG per tablet 1-2 tablet, 1-2 tablet, Oral, Q4H PRN, Therisa Doyne, MD, 2 tablet at 04/09/20 2105 .  nicotine (NICODERM CQ - dosed in mg/24 hours) patch 21 mg, 21 mg, Transdermal, Daily, Doutova, Anastassia, MD, 21 mg at 04/10/20 0840 .  piperacillin-tazobactam (ZOSYN) IVPB 3.375 g, 3.375 g, Intravenous, Q8H, Doutova, Anastassia, MD, Last Rate: 12.5 mL/hr at 04/10/20 1345, 3.375 g at 04/10/20 1345 .  traZODone (DESYREL) tablet 25 mg,  25 mg, Oral, QHS, Lurene ShadowAyiku, Bernard, MD, 25 mg at 04/09/20 2105    ALLERGIES   Patient has no known allergies.     REVIEW OF SYSTEMS    Review of Systems:  Gen:  Denies  fever, sweats, chills weigh loss  HEENT: Denies blurred vision, double vision, ear pain, eye pain, hearing loss, nose bleeds, sore throat Cardiac:  No dizziness, chest pain or heaviness, chest tightness,edema Resp:   Denies cough or sputum porduction, shortness of breath,wheezing, hemoptysis,  Gi: Denies swallowing difficulty, stomach  pain, nausea or vomiting, diarrhea, constipation, bowel incontinence Gu:  Denies bladder incontinence, burning urine Ext:   Denies Joint pain, stiffness or swelling Skin: Denies  skin rash, easy bruising or bleeding or hives Endoc:  Denies polyuria, polydipsia , polyphagia or weight change Psych:   Denies depression, insomnia or hallucinations   Other:  All other systems negative   VS: BP 127/75 (BP Location: Left Arm)   Pulse 91   Temp 99.2 F (37.3 C) (Oral)   Resp 18   Ht 5\' 11"  (1.803 m)   Wt 82.1 kg   SpO2 98%   BMI 25.24 kg/m      PHYSICAL EXAM    GENERAL:NAD, no fevers, chills, no weakness no fatigue HEAD: Normocephalic, atraumatic.  EYES: Pupils equal, round, reactive to light. Extraocular muscles intact. No scleral icterus.  MOUTH: Moist mucosal membrane. Dentition intact. No abscess noted.  EAR, NOSE, THROAT: Clear without exudates. No external lesions.  NECK: Supple. No thyromegaly. No nodules. No JVD.  PULMONARY: Diffuse coarse rhonchi right sided +wheezes CARDIOVASCULAR: S1 and S2. Regular rate and rhythm. No murmurs, rubs, or gallops. No edema. Pedal pulses 2+ bilaterally.  GASTROINTESTINAL: Soft, nontender, nondistended. No masses. Positive bowel sounds. No hepatosplenomegaly.  MUSCULOSKELETAL: No swelling, clubbing, or edema. Range of motion full in all extremities.  NEUROLOGIC: Cranial nerves II through XII are intact. No gross focal neurological deficits. Sensation intact. Reflexes intact.  SKIN: No ulceration, lesions, rashes, or cyanosis. Skin warm and dry. Turgor intact.  PSYCHIATRIC: Mood, affect within normal limits. The patient is awake, alert and oriented x 3. Insight, judgment intact.       IMAGING    DG Chest 2 View  Result Date: 04/08/2020 CLINICAL DATA:  Hemoptysis.  Sore throat and sweating. EXAM: CHEST - 2 VIEW COMPARISON:  Report from chest radiograph 11/14/2003 FINDINGS: Abnormal opacities in the right chest including a 5.7 by 4.8 cm  opacity in the right mid lung with central lucency, and a 5.2 by 3.2 cm right basilar opacity with central lucency. Possibilities may include cavitary pneumonia or malignancy. No blunting of the costophrenic angles. Cardiac and mediastinal margins appear normal. IMPRESSION: 1. Abnormal opacities with central lucencies in the right chest, differential diagnostic considerations include cavitary pneumonia or malignancy. Chest CT is recommended for further characterization. 2. No pleural effusion or airspace opacity observed. Electronically Signed   By: Gaylyn RongWalter  Liebkemann M.D.   On: 04/08/2020 15:29   CT Angio Chest PE W/Cm &/Or Wo Cm  Result Date: 04/08/2020 CLINICAL DATA:  Cavitary lesions on recent chest x-ray EXAM: CT ANGIOGRAPHY CHEST WITH CONTRAST TECHNIQUE: Multidetector CT imaging of the chest was performed using the standard protocol during bolus administration of intravenous contrast. Multiplanar CT image reconstructions and MIPs were obtained to evaluate the vascular anatomy. CONTRAST:  75mL OMNIPAQUE IOHEXOL 350 MG/ML SOLN COMPARISON:  Chest x-ray from earlier in the same day. FINDINGS: Cardiovascular: Thoracic aorta and its branches are within normal limits. No aneurysmal  dilatation or dissection is seen. Pulmonary artery shows a normal branching pattern. No filling defect to suggest pulmonary embolism is seen. No coronary calcifications are noted Mediastinum/Nodes: Thoracic inlet is unremarkable. 9 mm short axis lymph node is noted in the right paratracheal region. 14 mm short axis subcarinal node is seen. Right hilar lymph nodes are noted. The largest of these measures 11 mm in short axis. The esophagus as visualized is within normal limits. Lungs/Pleura: Left lung is well aerated. No focal infiltrate or sizable effusion is seen. There are 2 cavitary lesions identified within the right lower lobe. The more lateral of these measures 3.9 x 3.6 cm. Air-fluid level is noted within. Slightly more superior  and medial there is a 3.9 x 3.3 cm cavitary lesion with air-fluid level within. No sizable effusion or other focal parenchymal abnormality is noted. Upper Abdomen: Visualized upper abdomen shows fatty infiltration of the liver. Musculoskeletal: Degenerative changes of the thoracic spine are noted. No acute abnormality noted. Review of the MIP images confirms the above findings. IMPRESSION: Cavitary lesions within the right lower lobe with reactive lymphadenopathy. These changes likely represent multifocal lung abscesses although the possibility of neoplasm cannot be totally excluded. Follow-up CT following appropriate therapy is recommended. Electronically Signed   By: Alcide Clever M.D.   On: 04/08/2020 18:14   ECHOCARDIOGRAM COMPLETE  Result Date: 04/09/2020    ECHOCARDIOGRAM REPORT   Patient Name:   CANDEN CIESLINSKI Date of Exam: 04/09/2020 Medical Rec #:  [id number]   Height:       70.0 in Accession #:    [id number]  Weight:       170.0 lb Date of Birth:  [birth year]   BSA:          1.948 m Patient Age:    49 years    BP:           104/65 mmHg Patient Gender: M           HR:           83 bpm. Exam Location:  [facility] Procedure: 2D Echo, Color Doppler and Cardiac Doppler Indications:     R50.9 Fever  History:         Patient has no prior history of Echocardiogram examinations. Pt                  tested positive for COVID-19 on 08/10/19.  Sonographer:     Humphrey Rolls RDCS (AE) Referring Phys:  Kenn File DOUTOVA Diagnosing Phys: Lorine Bears MD IMPRESSIONS  1. Left ventricular ejection fraction, by estimation, is 55 to 60%. The left ventricle has normal function. The left ventricle has no regional wall motion abnormalities. Left ventricular diastolic parameters were normal.  2. Right ventricular systolic function is normal. The right ventricular size is normal. Tricuspid regurgitation signal is inadequate for assessing PA pressure.  3. The mitral valve is normal in structure. No evidence of mitral valve  regurgitation. No evidence of mitral stenosis.  4. The aortic valve is normal in structure. Aortic valve regurgitation is not visualized. No aortic stenosis is present.  5. The inferior vena cava is normal in size with greater than 50% respiratory variability, suggesting right atrial pressure of 3 mmHg. Conclusion(s)/Recommendation(s): No evidence of valvular vegetations on this transthoracic echocardiogram. FINDINGS  Left Ventricle: Left ventricular ejection fraction, by estimation, is 55 to 60%. The left ventricle has normal function. The left ventricle has no regional wall motion abnormalities. The left ventricular internal cavity size was  normal in size. There is  no left ventricular hypertrophy. Left ventricular diastolic parameters were normal. Right Ventricle: The right ventricular size is normal. No increase in right ventricular wall thickness. Right ventricular systolic function is normal. Tricuspid regurgitation signal is inadequate for assessing PA pressure. Left Atrium: Left atrial size was normal in size. Right Atrium: Right atrial size was normal in size. Pericardium: There is no evidence of pericardial effusion. Mitral Valve: The mitral valve is normal in structure. No evidence of mitral valve regurgitation. No evidence of mitral valve stenosis. MV peak gradient, 5.1 mmHg. The mean mitral valve gradient is 3.0 mmHg. Tricuspid Valve: The tricuspid valve is normal in structure. Tricuspid valve regurgitation is trivial. No evidence of tricuspid stenosis. Aortic Valve: The aortic valve is normal in structure. Aortic valve regurgitation is not visualized. No aortic stenosis is present. Aortic valve mean gradient measures 5.0 mmHg. Aortic valve peak gradient measures 10.5 mmHg. Aortic valve area, by VTI measures 2.42 cm. Pulmonic Valve: The pulmonic valve was normal in structure. Pulmonic valve regurgitation is not visualized. No evidence of pulmonic stenosis. Aorta: The aortic root is normal in size and  structure. Venous: The inferior vena cava is normal in size with greater than 50% respiratory variability, suggesting right atrial pressure of 3 mmHg. IAS/Shunts: No atrial level shunt detected by color flow Doppler.  LEFT VENTRICLE PLAX 2D LVIDd:         4.66 cm  Diastology LVIDs:         3.43 cm  LV e' medial:    8.70 cm/s LV PW:         0.91 cm  LV E/e' medial:  10.7 LV IVS:        0.77 cm  LV e' lateral:   13.60 cm/s LVOT diam:     2.10 cm  LV E/e' lateral: 6.8 LV SV:         65 LV SV Index:   34 LVOT Area:     3.46 cm  RIGHT VENTRICLE RV Basal diam:  2.57 cm LEFT ATRIUM             Index       RIGHT ATRIUM           Index LA diam:        2.70 cm 1.39 cm/m  RA Area:     10.90 cm LA Vol (A2C):   33.4 ml 17.15 ml/m RA Volume:   21.60 ml  11.09 ml/m LA Vol (A4C):   21.3 ml 10.93 ml/m LA Biplane Vol: 28.6 ml 14.68 ml/m  AORTIC VALVE                    PULMONIC VALVE AV Area (Vmax):    2.52 cm     PV Vmax:       1.22 m/s AV Area (Vmean):   2.32 cm     PV Vmean:      81.900 cm/s AV Area (VTI):     2.42 cm     PV VTI:        0.237 m AV Vmax:           162.00 cm/s  PV Peak grad:  6.0 mmHg AV Vmean:          105.000 cm/s PV Mean grad:  3.0 mmHg AV VTI:            0.270 m AV Peak Grad:      10.5 mmHg AV Mean Grad:  5.0 mmHg LVOT Vmax:         118.00 cm/s LVOT Vmean:        70.400 cm/s LVOT VTI:          0.189 m LVOT/AV VTI ratio: 0.70  AORTA Ao Root diam: 3.00 cm MITRAL VALVE MV Area (PHT): 5.54 cm    SHUNTS MV Peak grad:  5.1 mmHg    Systemic VTI:  0.19 m MV Mean grad:  3.0 mmHg    Systemic Diam: 2.10 cm MV Vmax:       1.13 m/s MV Vmean:      76.8 cm/s MV Decel Time: 137 msec MV E velocity: 93.00 cm/s MV A velocity: 74.60 cm/s MV E/A ratio:  1.25 Lorine Bears MD Electronically signed by Lorine Bears MD Signature Date/Time: 04/09/2020/12:37:37 PM    Final        ASSESSMENT/PLAN   Right lower lobe cavitary lesions - with poor response to empiric IV Zosyn  - associated with hilar/mediastinal  lymphadenopathy - as per radiology cannot rule out malignancy  - possible inflammatory vascultitis related - may need biopsy - endobronchial/ transbronchial -fungitell -respiratory culture - preliminary mixed species -agree that odds favor aspiration as etiology  - will evaluate if possible to have bronchoscopy  -noted additional microbiology pending - Gold Quant -will add ANA and ANCA -sputum cytology  -Infectious disease on case - appreciate input -Antimicrobials as per ID  -discussed bronchoscopy - patient agreeable.  -Reviewed risks/complications and benefits with patient, risks include infection, pneumothorax/pneumomediastinum which may require chest tube placement as well as overnight/prolonged hospitalization and possible mechanical ventilation. Other risks include bleeding and very rarely death.  Patient understands risks and wishes to proceed.  Additional questions were answered, and patient is aware that post procedure patient will be going home with family and may experience cough with possible clots on expectoration as well as phlegm which may last few days as well as hoarseness of voice post intubation and mechanical ventilation.         Thank you for allowing me to participate in the care of this patient.   Patient/Family are satisfied with care plan and all questions have been answered.   This document was prepared using Dragon voice recognition software and may include unintentional dictation errors.     Vida Rigger, M.D.  Division of Pulmonary & Critical Care Medicine  [facility]

## 2020-04-11 ENCOUNTER — Inpatient Hospital Stay: Payer: BC Managed Care – PPO | Admitting: Anesthesiology

## 2020-04-11 ENCOUNTER — Encounter
Admission: EM | Disposition: A | Discharge status: Home / Self Care | Payer: Self-pay | Source: Home / Self Care | Attending: Internal Medicine

## 2020-04-11 DIAGNOSIS — J851 Abscess of lung with pneumonia: Secondary | ICD-10-CM | POA: Diagnosis not present

## 2020-04-11 HISTORY — PX: FLEXIBLE BRONCHOSCOPY: SHX5094

## 2020-04-11 LAB — QUANTIFERON-TB GOLD PLUS (RQFGPL)
QuantiFERON Mitogen Value: 5.02 IU/mL
QuantiFERON Nil Value: 0 IU/mL
QuantiFERON TB1 Ag Value: 0 IU/mL
QuantiFERON TB2 Ag Value: 0 IU/mL

## 2020-04-11 LAB — QUANTIFERON-TB GOLD PLUS: QuantiFERON-TB Gold Plus: NEGATIVE

## 2020-04-11 LAB — CULTURE, RESPIRATORY W GRAM STAIN: Culture: NORMAL

## 2020-04-11 LAB — ACID FAST SMEAR (AFB, MYCOBACTERIA): Acid Fast Smear: NEGATIVE

## 2020-04-11 LAB — KOH PREP: KOH Prep: NONE SEEN

## 2020-04-11 SURGERY — BRONCHOSCOPY, FLEXIBLE
Anesthesia: General | Laterality: Right

## 2020-04-11 MED ORDER — LIDOCAINE HCL (CARDIAC) PF 100 MG/5ML IV SOSY
PREFILLED_SYRINGE | INTRAVENOUS | Status: DC | PRN
  Administered 2020-04-11: 80 mg via INTRAVENOUS

## 2020-04-11 MED ORDER — PROPOFOL 10 MG/ML IV BOLUS
INTRAVENOUS | Status: DC | PRN
  Administered 2020-04-11: 200 mg via INTRAVENOUS

## 2020-04-11 MED ORDER — DEXAMETHASONE SODIUM PHOSPHATE 10 MG/ML IJ SOLN
INTRAMUSCULAR | Status: AC
  Filled 2020-04-11: qty 1

## 2020-04-11 MED ORDER — ROCURONIUM BROMIDE 100 MG/10ML IV SOLN
INTRAVENOUS | Status: DC | PRN
  Administered 2020-04-11: 50 mg via INTRAVENOUS

## 2020-04-11 MED ORDER — LIDOCAINE HCL (PF) 2 % IJ SOLN
INTRAMUSCULAR | Status: AC
  Filled 2020-04-11: qty 5

## 2020-04-11 MED ORDER — FENTANYL CITRATE (PF) 100 MCG/2ML IJ SOLN
INTRAMUSCULAR | Status: AC
  Filled 2020-04-11: qty 2

## 2020-04-11 MED ORDER — DEXMEDETOMIDINE HCL IN NACL 80 MCG/20ML IV SOLN
INTRAVENOUS | Status: AC
  Filled 2020-04-11: qty 20

## 2020-04-11 MED ORDER — ROCURONIUM BROMIDE 10 MG/ML (PF) SYRINGE
PREFILLED_SYRINGE | INTRAVENOUS | Status: AC
  Filled 2020-04-11: qty 10

## 2020-04-11 MED ORDER — SUGAMMADEX SODIUM 200 MG/2ML IV SOLN
INTRAVENOUS | Status: DC | PRN
  Administered 2020-04-11: 100 mg via INTRAVENOUS
  Administered 2020-04-11: 200 mg via INTRAVENOUS

## 2020-04-11 MED ORDER — DEXAMETHASONE SODIUM PHOSPHATE 10 MG/ML IJ SOLN
INTRAMUSCULAR | Status: DC | PRN
  Administered 2020-04-11: 10 mg via INTRAVENOUS

## 2020-04-11 MED ORDER — MIDAZOLAM HCL 2 MG/2ML IJ SOLN
INTRAMUSCULAR | Status: AC
  Filled 2020-04-11: qty 2

## 2020-04-11 MED ORDER — ONDANSETRON HCL 4 MG/2ML IJ SOLN
INTRAMUSCULAR | Status: DC | PRN
  Administered 2020-04-11: 4 mg via INTRAVENOUS

## 2020-04-11 MED ORDER — DEXMEDETOMIDINE HCL 200 MCG/2ML IV SOLN
INTRAVENOUS | Status: DC | PRN
  Administered 2020-04-11: 20 ug via INTRAVENOUS

## 2020-04-11 MED ORDER — MIDAZOLAM HCL 2 MG/2ML IJ SOLN
INTRAMUSCULAR | Status: DC | PRN
  Administered 2020-04-11: 2 mg via INTRAVENOUS

## 2020-04-11 MED ORDER — FENTANYL CITRATE (PF) 100 MCG/2ML IJ SOLN
INTRAMUSCULAR | Status: DC | PRN
  Administered 2020-04-11: 100 ug via INTRAVENOUS

## 2020-04-11 MED ORDER — ONDANSETRON HCL 4 MG/2ML IJ SOLN
INTRAMUSCULAR | Status: AC
  Filled 2020-04-11: qty 2

## 2020-04-11 MED ORDER — PROPOFOL 10 MG/ML IV BOLUS
INTRAVENOUS | Status: AC
  Filled 2020-04-11: qty 20

## 2020-04-11 NOTE — Progress Notes (Addendum)
PROGRESS NOTE  Tanner Berg WLS:[id number] DOB: 11/21/1970 DOA: 04/08/2020 PCP: Ricardo Jericho, NP   LOS: 3 days   Brief narrative: As per HPI,  Tanner Berg is a 49 y.o. male with medical history significant for COVID-19 infection in February 2021, history of dental abscess, GERD, presented to the hospital with 4-day history of cough, hemoptysis, sore throat and night sweats.  He said he aspirated at night about 2 weeks prior to admission after he drank milk and cookies shortly before bedtime. He was found to have right lower lobe cavitary lesions concerning for multifocal lung abscess.  He was treated with empiric IV antibiotics.  ID and pulmonologist have been consulted to assist with management.  Assessment/Plan:  Principal Problem:   Cavitary pneumonia Active Problems:   Hemoptysis   Tobacco abuse   History of COVID-19   Dehydration  Fever/ right lower lobe cavitary pneumonia/multifocal abscess/hemoptysis: Continue IV Zosyn. Follow-up sputum AFB, sputum and blood cultures.  Pulmonary on board.  Dr. Lanney Gins, seen the patient and underwent bronchoscopy with bronchoalveolar lavage today.  Continue airborne isolation.  Follow ID and pulmonary input.  History of Covid pneumonia.  Denies any hemoptysis chest pain Volume depletion: Improved.    Off IV fluids  Insomnia: Trazodone as needed for sleep.  Tobacco use disorder: Continue nicotine patch  DVT prophylaxis: SCDs Start: 04/08/20 2021   Code Status: Full code  Family Communication: None  Status is: Inpatient  Remains inpatient appropriate because:IV treatments appropriate due to intensity of illness or inability to take PO, Inpatient level of care appropriate due to severity of illness and status post bronchoscopy with bronchoalveolar lavage on IV antibiotic.   Dispo: The patient is from: Home              Anticipated d/c is to: Home              Anticipated d/c date is: 3 days              Patient currently  is not medically stable to d/c.:  Pulmonary recommendations, BAL report   Consultants:  Pulmonary  Infectious disease  Procedures:  Bronchoscopy with bronchoalveolar lavage on 04/11/2020  Antibiotics:  . Zosyn IV 10/18>  Anti-infectives (From admission, onward)   Start     Dose/Rate Route Frequency Ordered Stop   04/09/20 0900  vancomycin (VANCOCIN) IVPB 1000 mg/200 mL premix  Status:  Discontinued        1,000 mg 200 mL/hr over 60 Minutes Intravenous Every 12 hours 04/08/20 2046 04/09/20 1831   04/09/20 0600  ceFEPIme (MAXIPIME) 2 g in sodium chloride 0.9 % 100 mL IVPB  Status:  Discontinued        2 g 200 mL/hr over 30 Minutes Intravenous Every 8 hours 04/08/20 2046 04/08/20 2113   04/09/20 0500  piperacillin-tazobactam (ZOSYN) IVPB 3.375 g        3.375 g 12.5 mL/hr over 240 Minutes Intravenous Every 8 hours 04/08/20 2113     04/08/20 2100  vancomycin (VANCOREADY) IVPB 2000 mg/400 mL        2,000 mg 200 mL/hr over 120 Minutes Intravenous  Once 04/08/20 2036 04/09/20 0052   04/08/20 1845  vancomycin (VANCOCIN) IVPB 1000 mg/200 mL premix  Status:  Discontinued        1,000 mg 200 mL/hr over 60 Minutes Intravenous  Once 04/08/20 1834 04/08/20 2036   04/08/20 1845  ceFEPIme (MAXIPIME) 2 g in sodium chloride 0.9 % 100 mL IVPB  2 g 200 mL/hr over 30 Minutes Intravenous  Once 04/08/20 1834 04/08/20 2131       Subjective: Today, patient was seen and examined at bedside.  Feels better after bronchoscopy.  No cough no chest pain no fevers no chills.  His breathing has overall improved as per the patient.  Denies hemoptysis.  Objective: Vitals:   04/10/20 2355 04/11/20 0453  BP: (!) 104/55 (!) 113/56  Pulse: 94 77  Resp: 20 18  Temp: 100.1 F (37.8 C) 98.1 F (36.7 C)  SpO2: 96% 96%    Intake/Output Summary (Last 24 hours) at 04/11/2020 0856 Last data filed at 04/11/2020 0500 Gross per 24 hour  Intake 588 ml  Output 1800 ml  Net -1212 ml   Filed Weights    04/08/20 1312 04/10/20 0141 04/11/20 0455  Weight: 77.1 kg 82.1 kg 82 kg   Body mass index is 25.21 kg/m.   Physical Exam:  GENERAL: Patient is alert awake and oriented. Not in obvious distress. HENT: No scleral pallor or icterus. Pupils equally reactive to light. Oral mucosa is moist.  Poor oral hygiene NECK: is supple, no gross swelling noted. CHEST: Clear to auscultation.  Crackles noted over the bases.  Diminished breath sounds bilaterally. CVS: S1 and S2 heard, no murmur. Regular rate and rhythm.  ABDOMEN: Soft, non-tender, bowel sounds are present. EXTREMITIES: No edema. CNS: Cranial nerves are intact. No focal motor deficits. SKIN: warm and dry without rashes.  Data Review: I have personally reviewed the following laboratory data and studies,  CBC: Recent Labs  Lab 04/08/20 1319  WBC 17.3*  18.2*  NEUTROABS 14.0*  HGB 14.1  14.5  HCT 41.2  41.7  MCV 89.8  87.6  PLT 419*  427   Basic Metabolic Panel: Recent Labs  Lab 04/08/20 1319 04/08/20 2051 04/09/20 0527  NA 135  --   --   K 3.7  --   --   CL 99  --   --   CO2 24  --   --   GLUCOSE 121*  --   --   BUN 14  --   --   CREATININE 0.94  --  0.79  CALCIUM 8.5*  --   --   MG  --  2.1  --   PHOS  --   --  2.7   Liver Function Tests: Recent Labs  Lab 04/08/20 1924  AST 28  ALT 44  ALKPHOS 86  BILITOT 0.8  PROT 8.5*  ALBUMIN 3.7   No results for input(s): LIPASE, AMYLASE in the last 168 hours. No results for input(s): AMMONIA in the last 168 hours. Cardiac Enzymes: Recent Labs  Lab 04/08/20 2051  CKTOTAL 101   BNP (last 3 results) No results for input(s): BNP in the last 8760 hours.  ProBNP (last 3 results) No results for input(s): PROBNP in the last 8760 hours.  CBG: No results for input(s): GLUCAP in the last 168 hours. Recent Results (from the past 240 hour(s))  Acid Fast Smear (AFB)     Status: None   Collection Time: 04/08/20  3:14 AM   Specimen: Sputum  Result Value Ref Range  Status   AFB Specimen Processing Concentration  Final   Acid Fast Smear Negative  Final    Comment: (NOTE) Performed At: [facility] [address], [place] [id number] Rush Farmer MD OH:[id number]    Source (AFB) SPUTUM  Final    Comment: Performed at [facility], [address] [place], [place] [address]  Respiratory Panel by RT PCR (Flu A&B, Covid) - Nasopharyngeal Swab     Status: None   Collection Time: 04/08/20  7:23 PM   Specimen: Nasopharyngeal Swab  Result Value Ref Range Status   SARS Coronavirus 2 by RT PCR NEGATIVE NEGATIVE Final    Comment: (NOTE) SARS-CoV-2 target nucleic acids are NOT DETECTED.  The SARS-CoV-2 RNA is generally detectable in upper respiratoy specimens during the acute phase of infection. The lowest concentration of SARS-CoV-2 viral copies this assay can detect is 131 copies/mL. A negative result does not preclude SARS-Cov-2 infection and should not be used as the sole basis for treatment or other patient management decisions. A negative result may occur with  improper specimen collection/handling, submission of specimen other than nasopharyngeal swab, presence of viral mutation(s) within the areas targeted by this assay, and inadequate number of viral copies (<131 copies/mL). A negative result must be combined with clinical observations, patient history, and epidemiological information. The expected result is Negative.  Fact Sheet for Patients:  PinkCheek.be  Fact Sheet for Healthcare Providers:  GravelBags.it  This test is no t yet approved or cleared by the [place] FDA and  has been authorized for detection and/or diagnosis of SARS-CoV-2 by FDA under an Emergency Use Authorization (EUA). This EUA will remain  in effect (meaning this test can be used) for the duration of the COVID-19 declaration under Section 564(b)(1) of the Act, 21 U.S.C. section  360bbb-3(b)(1), unless the authorization is terminated or revoked sooner.     Influenza A by PCR NEGATIVE NEGATIVE Final   Influenza B by PCR NEGATIVE NEGATIVE Final    Comment: (NOTE) The Xpert Xpress SARS-CoV-2/FLU/RSV assay is intended as an aid in  the diagnosis of influenza from Nasopharyngeal swab specimens and  should not be used as a sole basis for treatment. Nasal washings and  aspirates are unacceptable for Xpert Xpress SARS-CoV-2/FLU/RSV  testing.  Fact Sheet for Patients: PinkCheek.be  Fact Sheet for Healthcare Providers: GravelBags.it  This test is not yet approved or cleared by the [place] FDA and  has been authorized for detection and/or diagnosis of SARS-CoV-2 by  FDA under an Emergency Use Authorization (EUA). This EUA will remain  in effect (meaning this test can be used) for the duration of the  Covid-19 declaration under Section 564(b)(1) of the Act, 21  U.S.C. section 360bbb-3(b)(1), unless the authorization is  terminated or revoked. Performed at [facility], [address] [place], [place] [address]   Blood culture (routine x 2)     Status: None (Preliminary result)   Collection Time: 04/08/20  8:51 PM   Specimen: BLOOD  Result Value Ref Range Status   Specimen Description BLOOD RIGHT [facility]  Final   Special Requests   Final    BOTTLES DRAWN AEROBIC AND ANAEROBIC Blood Culture adequate volume   Culture   Final    NO GROWTH 3 DAYS Performed at [facility], [address], [place], [place] [address]    Report Status PENDING  Incomplete  Blood culture (routine x 2)     Status: None (Preliminary result)   Collection Time: 04/08/20  8:51 PM   Specimen: BLOOD LEFT HAND  Result Value Ref Range Status   Specimen Description BLOOD LEFT HAND  Final   Special Requests   Final    BOTTLES DRAWN AEROBIC AND ANAEROBIC Blood Culture adequate volume   Culture   Final    NO GROWTH 3  DAYS Performed at [facility], [address] [place], [place] [address]    Report Status PENDING  Incomplete  MRSA PCR Screening     Status: None   Collection Time: 04/08/20 10:07 PM   Specimen: Nasal Mucosa; Nasopharyngeal  Result Value Ref Range Status   MRSA by PCR NEGATIVE NEGATIVE Final    Comment:        The GeneXpert MRSA Assay (FDA approved for NASAL specimens only), is one component of a comprehensive MRSA colonization surveillance program. It is not intended to diagnose MRSA infection nor to guide or monitor treatment for MRSA infections. Performed at [facility], [address] [place], [place] [address]   Expectorated sputum assessment w rflx to resp cult     Status: None   Collection Time: 04/09/20  3:14 AM   Specimen: Sputum  Result Value Ref Range Status   Specimen Description EXPECTORATED SPUTUM  Final   Special Requests Immunocompromised  Final   Sputum evaluation   Final    THIS SPECIMEN IS ACCEPTABLE FOR SPUTUM CULTURE Performed at [facility], [address], [place], [place] [address]    Report Status 04/09/2020 FINAL  Final  Culture, respiratory     Status: None (Preliminary result)   Collection Time: 04/09/20  3:14 AM  Result Value Ref Range Status   Specimen Description   Final    EXPECTORATED SPUTUM Performed at [facility], [address], [place], [place] [address]    Special Requests   Final    Immunocompromised Reflexed from [id number] Performed at [facility], [place], [place] [address]    Gram Stain   Final    FEW SQUAMOUS EPITHELIAL CELLS PRESENT MODERATE WBC PRESENT,BOTH PMN AND MONONUCLEAR FEW GRAM POSITIVE COCCI FEW GRAM NEGATIVE RODS    Culture   Final    CULTURE REINCUBATED FOR BETTER GROWTH Performed at [facility], [address], [place], [place] [address]    Report Status PENDING  Incomplete     Studies: ECHOCARDIOGRAM COMPLETE  Result Date:  04/09/2020    ECHOCARDIOGRAM REPORT   Patient Name:   SAHID BORBA Date of Exam: 04/09/2020 Medical Rec #:  [id number]   Height:       70.0 in Accession #:    [id number]  Weight:       170.0 lb Date of Birth:  12-02-1970   BSA:          1.948 m Patient Age:    72 years    BP:           104/65 mmHg Patient Gender: M           HR:           83 bpm. Exam Location:  [facility] Procedure: 2D Echo, Color Doppler and Cardiac Doppler Indications:     R50.9 Fever  History:         Patient has no prior history of Echocardiogram examinations. Pt                  tested positive for COVID-19 on 08/10/19.  Sonographer:     Charmayne Sheer RDCS (AE) Referring Phys:  [place] Diagnosing Phys: Kathlyn Sacramento MD IMPRESSIONS  1. Left ventricular ejection fraction, by estimation, is 55 to 60%. The left ventricle has normal function. The left ventricle has no regional wall motion abnormalities. Left ventricular diastolic parameters were normal.  2. Right ventricular systolic function is normal.  The right ventricular size is normal. Tricuspid regurgitation signal is inadequate for assessing PA pressure.  3. The mitral valve is normal in structure. No evidence of mitral valve regurgitation. No evidence of mitral stenosis.  4. The aortic valve is normal in structure. Aortic valve regurgitation is not visualized. No aortic stenosis is present.  5. The inferior vena cava is normal in size with greater than 50% respiratory variability, suggesting right atrial pressure of 3 mmHg. Conclusion(s)/Recommendation(s): No evidence of valvular vegetations on this transthoracic echocardiogram. FINDINGS  Left Ventricle: Left ventricular ejection fraction, by estimation, is 55 to 60%. The left ventricle has normal function. The left ventricle has no regional wall motion abnormalities. The left ventricular internal cavity size was normal in size. There is  no left ventricular hypertrophy. Left ventricular diastolic parameters were normal. Right  Ventricle: The right ventricular size is normal. No increase in right ventricular wall thickness. Right ventricular systolic function is normal. Tricuspid regurgitation signal is inadequate for assessing PA pressure. Left Atrium: Left atrial size was normal in size. Right Atrium: Right atrial size was normal in size. Pericardium: There is no evidence of pericardial effusion. Mitral Valve: The mitral valve is normal in structure. No evidence of mitral valve regurgitation. No evidence of mitral valve stenosis. MV peak gradient, 5.1 mmHg. The mean mitral valve gradient is 3.0 mmHg. Tricuspid Valve: The tricuspid valve is normal in structure. Tricuspid valve regurgitation is trivial. No evidence of tricuspid stenosis. Aortic Valve: The aortic valve is normal in structure. Aortic valve regurgitation is not visualized. No aortic stenosis is present. Aortic valve mean gradient measures 5.0 mmHg. Aortic valve peak gradient measures 10.5 mmHg. Aortic valve area, by VTI measures 2.42 cm. Pulmonic Valve: The pulmonic valve was normal in structure. Pulmonic valve regurgitation is not visualized. No evidence of pulmonic stenosis. Aorta: The aortic root is normal in size and structure. Venous: The inferior vena cava is normal in size with greater than 50% respiratory variability, suggesting right atrial pressure of 3 mmHg. IAS/Shunts: No atrial level shunt detected by color flow Doppler.  LEFT VENTRICLE PLAX 2D LVIDd:         4.66 cm  Diastology LVIDs:         3.43 cm  LV e' medial:    8.70 cm/s LV PW:         0.91 cm  LV E/e' medial:  10.7 LV IVS:        0.77 cm  LV e' lateral:   13.60 cm/s LVOT diam:     2.10 cm  LV E/e' lateral: 6.8 LV SV:         65 LV SV Index:   34 LVOT Area:     3.46 cm  RIGHT VENTRICLE RV Basal diam:  2.57 cm LEFT ATRIUM             Index       RIGHT ATRIUM           Index LA diam:        2.70 cm 1.39 cm/m  RA Area:     10.90 cm LA Vol (A2C):   33.4 ml 17.15 ml/m RA Volume:   21.60 ml  11.09 ml/m LA  Vol (A4C):   21.3 ml 10.93 ml/m LA Biplane Vol: 28.6 ml 14.68 ml/m  AORTIC VALVE                    PULMONIC VALVE AV Area (Vmax):    2.52 cm  PV Vmax:       1.22 m/s AV Area (Vmean):   2.32 cm     PV Vmean:      81.900 cm/s AV Area (VTI):     2.42 cm     PV VTI:        0.237 m AV Vmax:           162.00 cm/s  PV Peak grad:  6.0 mmHg AV Vmean:          105.000 cm/s PV Mean grad:  3.0 mmHg AV VTI:            0.270 m AV Peak Grad:      10.5 mmHg AV Mean Grad:      5.0 mmHg LVOT Vmax:         118.00 cm/s LVOT Vmean:        70.400 cm/s LVOT VTI:          0.189 m LVOT/AV VTI ratio: 0.70  AORTA Ao Root diam: 3.00 cm MITRAL VALVE MV Area (PHT): 5.54 cm    SHUNTS MV Peak grad:  5.1 mmHg    Systemic VTI:  0.19 m MV Mean grad:  3.0 mmHg    Systemic Diam: 2.10 cm MV Vmax:       1.13 m/s MV Vmean:      76.8 cm/s MV Decel Time: 137 msec MV E velocity: 93.00 cm/s MV A velocity: 74.60 cm/s MV E/A ratio:  1.25 Kathlyn Sacramento MD Electronically signed by Kathlyn Sacramento MD Signature Date/Time: 04/09/2020/12:37:37 PM    Final       Flora Lipps, MD  [facility] 04/11/2020

## 2020-04-11 NOTE — Anesthesia Postprocedure Evaluation (Signed)
Anesthesia Post Note  Patient: Tanner Berg  Procedure(s) Performed: FLEXIBLE BRONCHOSCOPY (Right )  Patient location during evaluation: Other (in OR) Anesthesia Type: General Level of consciousness: awake and alert Pain management: pain level controlled Vital Signs Assessment: post-procedure vital signs reviewed and stable Respiratory status: spontaneous breathing, nonlabored ventilation, respiratory function stable and patient connected to nasal cannula oxygen Cardiovascular status: blood pressure returned to baseline and stable Postop Assessment: no apparent nausea or vomiting Anesthetic complications: no Comments: Recovered in OR due to TB precautions   No complications documented.   Last Vitals:  Vitals:   04/11/20 0944 04/11/20 0950  BP: 96/62 (!) 103/55  Pulse:  79  Resp: 20 17  Temp:  37.1 C  SpO2: 97% 99%    Last Pain:  Vitals:   04/11/20 0453  TempSrc: Oral  PainSc:                  Arita Miss

## 2020-04-11 NOTE — Progress Notes (Signed)
Pulmonary Medicine          Date: 04/11/2020,   MRN# [id number] Tanner Berg 1971-02-03     AdmissionWeight: 77.1 kg                 CurrentWeight: 82 kg  Referring physician: Dr Myriam Forehand    CHIEF COMPLAINT:   Multiple cavitary lesions of right lung with hilar and mediastinal lymphadenopathy   HISTORY OF PRESENT ILLNESS   49 M with PMH as below who came in after sub-acute course of cough with recurrent non-massive hemoptysis. Reports subjective fevers but no specific sick contacts , does admit to malaise, and flu like illness. Had COVID19 07/2019. Returned to ED due to recurrent cough with blood streaked expectorate. Patient found to have leukocytosis and pneumonia on initial screening CXR in ED. He was admitted to hospitalist [facility] service with empiric therapy for CAP. He was evaluated by ID specialist. He had respiratory cultures done with mixed flora growing on background on inflammatory cells.  Follow up CT chest imaging was done showing right lower lobe thick walled cavitary lesion with air-fluid level consistent with abscess.  Patient does have poor dentition and does admit to regurgitation with GERD hx. He is still febrile and has been treated with IV Zosyn however seems to have very little appreciable improvement.  Case discussed with infectious disease specialist who raises concern regarding poor response to therapy with subsequent request for bronchoscopic evaluation and invasive specimen to rule out other etiologies such as maignancy , rare indolent infections such as nocardia, fungal etiology, vasculiltides.  Family hx of cancer includes prostate cancer on both sides.  Denies hx of autoimmune disease hx, has hx of DM family hx.  He takes asprin last pill 1 wk ago. He actively smokes 1/2 pack daily.    04/11/20- patient resting in bed this am states he slept well.  He is on room air, has phelgm this morning on expectoration and rhonchi posteriorly on auscultation of right  lung. Plan for bronchoscopy this am.    PAST MEDICAL HISTORY   No past medical history on file.   SURGICAL HISTORY   History reviewed. No pertinent surgical history.   FAMILY HISTORY   Family History  Problem Relation Age of Onset  . Hypertension Other      SOCIAL HISTORY   Social History   Tobacco Use  . Smoking status: Current Every Day Smoker    Packs/day: 1.00    Types: Cigarettes  . Smokeless tobacco: Never Used  Vaping Use  . Vaping Use: Never used  Substance Use Topics  . Alcohol use: No  . Drug use: Never     MEDICATIONS    Home Medication:    Current Medication:  Current Facility-Administered Medications:  .  0.9 %  sodium chloride infusion, , Intravenous, PRN, Therisa Doyne, MD, Stopped at 04/10/20 1849 .  acetaminophen (TYLENOL) tablet 650 mg, 650 mg, Oral, Q6H PRN, 650 mg at 04/10/20 2335 **OR** acetaminophen (TYLENOL) suppository 650 mg, 650 mg, Rectal, Q6H PRN, Doutova, Anastassia, MD .  butamben-tetracaine-benzocaine (CETACAINE) spray 1 spray, 1 spray, Topical, Once, Johan Creveling, MD .  guaiFENesin-dextromethorphan (ROBITUSSIN DM) 100-10 MG/5ML syrup 5 mL, 5 mL, Oral, Q4H PRN, Andris Baumann, MD, 5 mL at 04/09/20 0548 .  HYDROcodone-acetaminophen (NORCO/VICODIN) 5-325 MG per tablet 1-2 tablet, 1-2 tablet, Oral, Q4H PRN, Therisa Doyne, MD, 2 tablet at 04/09/20 2105 .  lidocaine (XYLOCAINE) 2 % jelly 1 application, 1 application, Topical, Once, Makhya Arave,  Taeshawn Helfman, MD .  nicotine (NICODERM CQ - dosed in mg/24 hours) patch 21 mg, 21 mg, Transdermal, Daily, Doutova, Anastassia, MD, 21 mg at 04/10/20 0840 .  phenylephrine (NEO-SYNEPHRINE) 0.25 % nasal spray 1 spray, 1 spray, Each Nare, Q6H PRN, Karna Christmas, Berneice Zettlemoyer, MD .  piperacillin-tazobactam (ZOSYN) IVPB 3.375 g, 3.375 g, Intravenous, Q8H, Doutova, Anastassia, MD, Last Rate: 12.5 mL/hr at 04/11/20 0455, 3.375 g at 04/11/20 0455 .  traZODone (DESYREL) tablet 25 mg, 25 mg, Oral, QHS, Lurene Shadow, MD, 25 mg at 04/10/20 2153    ALLERGIES   Patient has no known allergies.     REVIEW OF SYSTEMS    Review of Systems:  Gen:  Denies  fever, sweats, chills weigh loss  HEENT: Denies blurred vision, double vision, ear pain, eye pain, hearing loss, nose bleeds, sore throat Cardiac:  No dizziness, chest pain or heaviness, chest tightness,edema Resp:   Denies cough or sputum porduction, shortness of breath,wheezing, hemoptysis,  Gi: Denies swallowing difficulty, stomach pain, nausea or vomiting, diarrhea, constipation, bowel incontinence Gu:  Denies bladder incontinence, burning urine Ext:   Denies Joint pain, stiffness or swelling Skin: Denies  skin rash, easy bruising or bleeding or hives Endoc:  Denies polyuria, polydipsia , polyphagia or weight change Psych:   Denies depression, insomnia or hallucinations   Other:  All other systems negative   VS: BP (!) 113/56 (BP Location: Left Arm)   Pulse 77   Temp 98.1 F (36.7 C) (Oral)   Resp 18   Ht  (1.803 m)   Wt 82 kg   SpO2 96%   BMI 25.21 kg/m      PHYSICAL EXAM    GENERAL:NAD, no fevers, chills, no weakness no fatigue HEAD: Normocephalic, atraumatic.  EYES: Pupils equal, round, reactive to light. Extraocular muscles intact. No scleral icterus.  MOUTH: Moist mucosal membrane. Dentition intact. No abscess noted.  EAR, NOSE, THROAT: Clear without exudates. No external lesions.  NECK: Supple. No thyromegaly. No nodules. No JVD.  PULMONARY: rhonchi on right.  CARDIOVASCULAR: S1 and S2. Regular rate and rhythm. No murmurs, rubs, or gallops. No edema. Pedal pulses 2+ bilaterally.  GASTROINTESTINAL: Soft, nontender, nondistended. No masses. Positive bowel sounds. No hepatosplenomegaly.  MUSCULOSKELETAL: No swelling, clubbing, or edema. Range of motion full in all extremities.  NEUROLOGIC: Cranial nerves II through XII are intact. No gross focal neurological deficits. Sensation intact. Reflexes intact.    SKIN: No ulceration, lesions, rashes, or cyanosis. Skin warm and dry. Turgor intact.  PSYCHIATRIC: Mood, affect within normal limits. The patient is awake, alert and oriented x 3. Insight, judgment intact.       IMAGING    DG Chest 2 View  Result Date: 04/08/2020 CLINICAL DATA:  Hemoptysis.  Sore throat and sweating. EXAM: CHEST - 2 VIEW COMPARISON:  Report from chest radiograph 11/14/2003 FINDINGS: Abnormal opacities in the right chest including a 5.7 by 4.8 cm opacity in the right mid lung with central lucency, and a 5.2 by 3.2 cm right basilar opacity with central lucency. Possibilities may include cavitary pneumonia or malignancy. No blunting of the costophrenic angles. Cardiac and mediastinal margins appear normal. IMPRESSION: 1. Abnormal opacities with central lucencies in the right chest, differential diagnostic considerations include cavitary pneumonia or malignancy. Chest CT is recommended for further characterization. 2. No pleural effusion or airspace opacity observed. Electronically Signed   By: Gaylyn Rong M.D.   On: 04/08/2020 15:29   CT Angio Chest PE W/Cm &/Or Wo Cm  Result Date:  04/08/2020 CLINICAL DATA:  Cavitary lesions on recent chest x-ray EXAM: CT ANGIOGRAPHY CHEST WITH CONTRAST TECHNIQUE: Multidetector CT imaging of the chest was performed using the standard protocol during bolus administration of intravenous contrast. Multiplanar CT image reconstructions and MIPs were obtained to evaluate the vascular anatomy. CONTRAST:  60mL OMNIPAQUE IOHEXOL 350 MG/ML SOLN COMPARISON:  Chest x-ray from earlier in the same day. FINDINGS: Cardiovascular: Thoracic aorta and its branches are within normal limits. No aneurysmal dilatation or dissection is seen. Pulmonary artery shows a normal branching pattern. No filling defect to suggest pulmonary embolism is seen. No coronary calcifications are noted Mediastinum/Nodes: Thoracic inlet is unremarkable. 9 mm short axis lymph node is  noted in the right paratracheal region. 14 mm short axis subcarinal node is seen. Right hilar lymph nodes are noted. The largest of these measures 11 mm in short axis. The esophagus as visualized is within normal limits. Lungs/Pleura: Left lung is well aerated. No focal infiltrate or sizable effusion is seen. There are 2 cavitary lesions identified within the right lower lobe. The more lateral of these measures 3.9 x 3.6 cm. Air-fluid level is noted within. Slightly more superior and medial there is a 3.9 x 3.3 cm cavitary lesion with air-fluid level within. No sizable effusion or other focal parenchymal abnormality is noted. Upper Abdomen: Visualized upper abdomen shows fatty infiltration of the liver. Musculoskeletal: Degenerative changes of the thoracic spine are noted. No acute abnormality noted. Review of the MIP images confirms the above findings. IMPRESSION: Cavitary lesions within the right lower lobe with reactive lymphadenopathy. These changes likely represent multifocal lung abscesses although the possibility of neoplasm cannot be totally excluded. Follow-up CT following appropriate therapy is recommended. Electronically Signed   By: Alcide Clever M.D.   On: 04/08/2020 18:14   ECHOCARDIOGRAM COMPLETE  Result Date: 04/09/2020    ECHOCARDIOGRAM REPORT   Patient Name:   JOAH PATLAN Date of Exam: 04/09/2020 Medical Rec #:  [id number]   Height:       70.0 in Accession #:    [id number]  Weight:       170.0 lb Date of Birth:  05/26/1971   BSA:          1.948 m Patient Age:    49 years    BP:           104/65 mmHg Patient Gender: M           HR:           83 bpm. Exam Location:  [facility] Procedure: 2D Echo, Color Doppler and Cardiac Doppler Indications:     R50.9 Fever  History:         Patient has no prior history of Echocardiogram examinations. Pt                  tested positive for COVID-19 on 08/10/19.  Sonographer:     Humphrey Rolls RDCS (AE) Referring Phys:  Kenn File DOUTOVA Diagnosing Phys: Lorine Bears MD IMPRESSIONS  1. Left ventricular ejection fraction, by estimation, is 55 to 60%. The left ventricle has normal function. The left ventricle has no regional wall motion abnormalities. Left ventricular diastolic parameters were normal.  2. Right ventricular systolic function is normal. The right ventricular size is normal. Tricuspid regurgitation signal is inadequate for assessing PA pressure.  3. The mitral valve is normal in structure. No evidence of mitral valve regurgitation. No evidence of mitral stenosis.  4. The aortic valve is normal in structure. Aortic  valve regurgitation is not visualized. No aortic stenosis is present.  5. The inferior vena cava is normal in size with greater than 50% respiratory variability, suggesting right atrial pressure of 3 mmHg. Conclusion(s)/Recommendation(s): No evidence of valvular vegetations on this transthoracic echocardiogram. FINDINGS  Left Ventricle: Left ventricular ejection fraction, by estimation, is 55 to 60%. The left ventricle has normal function. The left ventricle has no regional wall motion abnormalities. The left ventricular internal cavity size was normal in size. There is  no left ventricular hypertrophy. Left ventricular diastolic parameters were normal. Right Ventricle: The right ventricular size is normal. No increase in right ventricular wall thickness. Right ventricular systolic function is normal. Tricuspid regurgitation signal is inadequate for assessing PA pressure. Left Atrium: Left atrial size was normal in size. Right Atrium: Right atrial size was normal in size. Pericardium: There is no evidence of pericardial effusion. Mitral Valve: The mitral valve is normal in structure. No evidence of mitral valve regurgitation. No evidence of mitral valve stenosis. MV peak gradient, 5.1 mmHg. The mean mitral valve gradient is 3.0 mmHg. Tricuspid Valve: The tricuspid valve is normal in structure. Tricuspid valve regurgitation is trivial. No evidence of  tricuspid stenosis. Aortic Valve: The aortic valve is normal in structure. Aortic valve regurgitation is not visualized. No aortic stenosis is present. Aortic valve mean gradient measures 5.0 mmHg. Aortic valve peak gradient measures 10.5 mmHg. Aortic valve area, by VTI measures 2.42 cm. Pulmonic Valve: The pulmonic valve was normal in structure. Pulmonic valve regurgitation is not visualized. No evidence of pulmonic stenosis. Aorta: The aortic root is normal in size and structure. Venous: The inferior vena cava is normal in size with greater than 50% respiratory variability, suggesting right atrial pressure of 3 mmHg. IAS/Shunts: No atrial level shunt detected by color flow Doppler.  LEFT VENTRICLE PLAX 2D LVIDd:         4.66 cm  Diastology LVIDs:         3.43 cm  LV e' medial:    8.70 cm/s LV PW:         0.91 cm  LV E/e' medial:  10.7 LV IVS:        0.77 cm  LV e' lateral:   13.60 cm/s LVOT diam:     2.10 cm  LV E/e' lateral: 6.8 LV SV:         65 LV SV Index:   34 LVOT Area:     3.46 cm  RIGHT VENTRICLE RV Basal diam:  2.57 cm LEFT ATRIUM             Index       RIGHT ATRIUM           Index LA diam:        2.70 cm 1.39 cm/m  RA Area:     10.90 cm LA Vol (A2C):   33.4 ml 17.15 ml/m RA Volume:   21.60 ml  11.09 ml/m LA Vol (A4C):   21.3 ml 10.93 ml/m LA Biplane Vol: 28.6 ml 14.68 ml/m  AORTIC VALVE                    PULMONIC VALVE AV Area (Vmax):    2.52 cm     PV Vmax:       1.22 m/s AV Area (Vmean):   2.32 cm     PV Vmean:      81.900 cm/s AV Area (VTI):     2.42 cm  PV VTI:        0.237 m AV Vmax:           162.00 cm/s  PV Peak grad:  6.0 mmHg AV Vmean:          105.000 cm/s PV Mean grad:  3.0 mmHg AV VTI:            0.270 m AV Peak Grad:      10.5 mmHg AV Mean Grad:      5.0 mmHg LVOT Vmax:         118.00 cm/s LVOT Vmean:        70.400 cm/s LVOT VTI:          0.189 m LVOT/AV VTI ratio: 0.70  AORTA Ao Root diam: 3.00 cm MITRAL VALVE MV Area (PHT): 5.54 cm    SHUNTS MV Peak grad:  5.1 mmHg     Systemic VTI:  0.19 m MV Mean grad:  3.0 mmHg    Systemic Diam: 2.10 cm MV Vmax:       1.13 m/s MV Vmean:      76.8 cm/s MV Decel Time: 137 msec MV E velocity: 93.00 cm/s MV A velocity: 74.60 cm/s MV E/A ratio:  1.25 Lorine BearsMuhammad Arida MD Electronically signed by Lorine BearsMuhammad Arida MD Signature Date/Time: 04/09/2020/12:37:37 PM    Final        ASSESSMENT/PLAN   Right lower lobe cavitary lesions - with poor response to empiric IV Zosyn  - associated with hilar/mediastinal lymphadenopathy - as per radiology cannot rule out malignancy  - possible inflammatory vascultitis related - may need biopsy - endobronchial/ transbronchial -fungitell -respiratory culture - preliminary mixed species -agree that odds favor aspiration as etiology  - will evaluate if possible to have bronchoscopy  -noted additional microbiology pending - Gold Quant -will add ANA and ANCA -sputum cytology  -Infectious disease on case - appreciate input -Antimicrobials as per ID  -discussed bronchoscopy - patient agreeable.  -Reviewed risks/complications and benefits with patient, risks include infection, pneumothorax/pneumomediastinum which may require chest tube placement as well as overnight/prolonged hospitalization and possible mechanical ventilation. Other risks include bleeding and very rarely death.  Patient understands risks and wishes to proceed.  Additional questions were answered, and patient is aware that post procedure patient will be going home with family and may experience cough with possible clots on expectoration as well as phlegm which may last few days as well as hoarseness of voice post intubation and mechanical ventilation. -04/11/20- reviewed plan for bronch again with patient, he discussed with wife and wishes to proceed.         Thank you for allowing me to participate in the care of this patient.   Patient/Family are satisfied with care plan and all questions have been answered.   This document was  prepared using Dragon voice recognition software and may include unintentional dictation errors.     Vida RiggerFuad Ilo Beamon, M.D.  Division of Pulmonary & Critical Care Medicine  [facility]

## 2020-04-11 NOTE — Anesthesia Procedure Notes (Signed)
Procedure Name: Intubation Date/Time: 04/11/2020 8:41 AM Performed by: Omer Jack, CRNA Pre-anesthesia Checklist: Patient identified, Patient being monitored, Timeout performed, Emergency Drugs available and Suction available Patient Re-evaluated:Patient Re-evaluated prior to induction Oxygen Delivery Method: Circle system utilized Preoxygenation: Pre-oxygenation with 100% oxygen Induction Type: IV induction Ventilation: Mask ventilation without difficulty Laryngoscope Size: McGraph and 4 Grade View: Grade I Tube type: Oral Tube size: 8.5 mm Number of attempts: 1 Airway Equipment and Method: Stylet Placement Confirmation: ETT inserted through vocal cords under direct vision,  positive ETCO2 and breath sounds checked- equal and bilateral Secured at: 21 cm Tube secured with: Tape Dental Injury: Teeth and Oropharynx as per pre-operative assessment

## 2020-04-11 NOTE — Procedures (Signed)
FLEXIBLE BRONCHOSCOPY PROCEDURE NOTE    Flexible bronchoscopy was performed on 04/11/20 by : Lanney Gins  assistance by : 1)Shannon Mabe  and 2)Anesthesia team and 3) [place] staff   Indication for the procedure was :  Pre-procedural H&P. The following assessment was performed on the day of the procedure prior to initiating sedation History:  Chest pain n Dyspnea n Hemoptysis n Cough y Fever n Other pertinent items y  Examination Vital signs -reviewed as per nursing documentation today Cardiac    Murmurs: n  Rubs : n  Gallop: n Lungs Wheezing: n Rales : y Rhonchi :y  Other pertinent findings: n   Pre-procedural assessment for Procedural Sedation included: Depth of sedation: As per anesthesia team  ASA Classification:  2 Mallampati airway assessment: 2    Medication list reviewed: y  The patient's interval history was taken and revealed: no new complaints The pre- procedure physical examination revealed: No new findings Refer to prior clinic note for details.  Informed Consent: Informed consent was obtained from:  patient after explanation of procedure and risks, benefits, as well as alternative procedures available.  Explanation of level of sedation and possible transfusion was also provided.    Procedural Preparation: Time out was performed and patient was identified by name and birthdate and procedure to be performed and side for sampling, if any, was specified. Pt was intubated by anesthesia.  The patient was appropriately draped.  Procedure Findings: Bronchoscope was inserted via ETT  without difficulty.  Posterior oropharynx, epiglottis, arytenoids, false cords and vocal cords were not visualized as these were bypassed by endotracheal tube.   The distal trachea was normal in circumference and appearance without mucosal, cartilaginous or branching abnormalities.  The main carina was non-splayed .   All right and left lobar airways were visualized to the  Sub-segmental level.  Sub- sub segmental carinae were identified in all the distal airways.   Secretions were visible in the following airways and appeared to be normal.  The mucosa was : nonfriable  Airways were notable for:        exophytic lesions :none       extrinsic compression in the following distributions: none.       Friable mucosa: none       Anthrocotic material /pigmentation: none   Pictorial documentation attached: no      Specimens obtained included:    Broncho-alveolar lavage site:RLL posterior, superior and lateral segments  sent for micro and cytology                             154m volume infused 875mvolume returned with cellular and mucoid appearance  Immediate sampling complications included:n Epinephrine 54m56mas used topically  The bronchoscopy was terminated due to completion of the planned procedure and the bronchoscope was removed.   Total dosage of Lidocaine was 54mg42mtal fluoroscopy time was 0 minutes    Estimated Blood loss: 0cc.  Complications included:  0    Disposition: back to inpatient   Follow up with Dr. AlesLanney Gins10 days for result discussion.   FredClaudette Stapler [facility] DPalo Pintoision of Pulmonary & Critical Care Medicine

## 2020-04-11 NOTE — Progress Notes (Signed)
ID Patient had bronchoscopy today.  Feeling much better Able to breathe better Patient Vitals for the past 24 hrs:  BP Temp Temp src Pulse Resp SpO2 Weight  04/11/20 1503 (!) 110/55 98.4 F (36.9 C) Oral 72 17 97 % --  04/11/20 1228 110/71 98.3 F (36.8 C) Oral 79 17 95 % --  04/11/20 0950 (!) 103/55 98.7 F (37.1 C) -- 79 17 99 % --  04/11/20 0944 96/62 -- -- -- 20 97 % --  04/11/20 0455 -- -- -- -- -- -- 82 kg  04/11/20 0453 (!) 113/56 98.1 F (36.7 C) Oral 77 18 96 % --  04/10/20 2355 (!) 104/55 100.1 F (37.8 C) Oral 94 20 96 % --  04/10/20 2020 112/66 98.9 F (37.2 C) Oral 97 20 97 % --    Awake and alert Very poor denition Chest bilateral air entry Crepitations and rhonchi on the right side is less Heart sounds S1-S2 Abdomen soft CNS non focal  Labs CBC Latest Ref Rng & Units 04/08/2020 04/08/2020 10/05/2017  WBC 4.0 - 10.5 K/uL 17.3(H) 18.2(H) 10.9(H)  Hemoglobin 13.0 - 17.0 g/dL 14.1 14.5 16.3  Hematocrit 39 - 52 % 41.2 41.7 47.2  Platelets 150 - 400 K/uL 419(H) 381 276    CMP Latest Ref Rng & Units 04/09/2020 04/08/2020 10/05/2017  Glucose 70 - 99 mg/dL - 121(H) 146(H)  BUN 6 - 20 mg/dL - 14 13  Creatinine 0.61 - 1.24 mg/dL 0.79 0.94 1.04  Sodium 135 - 145 mmol/L - 135 138  Potassium 3.5 - 5.1 mmol/L - 3.7 3.9  Chloride 98 - 111 mmol/L - 99 104  CO2 22 - 32 mmol/L - 24 27  Calcium 8.9 - 10.3 mg/dL - 8.5(L) 9.0  Total Protein 6.5 - 8.1 g/dL - 8.5(H) -  Total Bilirubin 0.3 - 1.2 mg/dL - 0.8 -  Alkaline Phos 38 - 126 U/L - 86 -  AST 15 - 41 U/L - 28 -  ALT 0 - 44 U/L - 44 -    Microbiology Sputum culture negative so far Sputum AFB smear is negative Blood culture negative   Impression and recommendation  Lung abscesses on the right lobe.  Thick-walled Air-fluid level Today he underwent bronchoscopy and bronchoalveolar fluid was sent for bacterial, fungal and AFB cultures. The etiology of the lung abscesses could be aspiration leading to necrotizing  pneumonia complicated by lung abscesses versus other bacterial infection versus nocardia which is less likely as patient is not immunocompromised.  Tuberculosis is less likely. Currently on Zosyn.  May be able to switch to Unasyn as the regular sputum culture was no growth and will wait for the BAL cultures to finalize. May repeat CXr to look for change After initial IV antibiotic for 10-14 days may be able to switch to PO  Poor dentition- he will need complete extraction as OP  Discussed the managementwith the patient

## 2020-04-11 NOTE — Transfer of Care (Signed)
Immediate Anesthesia Transfer of Care Note  Patient: Tanner Berg  Procedure(s) Performed: FLEXIBLE BRONCHOSCOPY (Right )  Patient Location: Nursing Unit  Anesthesia Type:General  Level of Consciousness: awake, alert  and oriented  Airway & Oxygen Therapy: Patient Spontanous Breathing and Patient connected to face mask oxygen  Post-op Assessment: Report given to RN and Post -op Vital signs reviewed and stable  Post vital signs: Reviewed and stable  Last Vitals:  Vitals Value Taken Time  BP 96/62 04/11/20 0944  Temp    Pulse    Resp 20 04/11/20 0944  SpO2 97 % 04/11/20 0944    Last Pain:  Vitals:   04/11/20 0453  TempSrc: Oral  PainSc:       Patients Stated Pain Goal: 0 (90/38/33 3832)  Complications: No complications documented.

## 2020-04-12 ENCOUNTER — Encounter: Payer: Self-pay | Admitting: Pulmonary Disease

## 2020-04-12 LAB — ANA COMPREHENSIVE PANEL

## 2020-04-12 LAB — ACID FAST SMEAR (AFB, MYCOBACTERIA): Acid Fast Smear: NEGATIVE

## 2020-04-12 LAB — ANCA TITERS
Atypical P-ANCA titer: <1:20 {titer}
C-ANCA: <1:20 {titer}
P-ANCA: <1:20 {titer}

## 2020-04-12 LAB — CYTOLOGY - NON PAP

## 2020-04-12 MED ORDER — SODIUM CHLORIDE 0.9 % IV SOLN
3.0000 g | Freq: Four times a day (QID) | INTRAVENOUS | Status: DC
  Administered 2020-04-12 – 2020-04-13 (×3): 3 g via INTRAVENOUS
  Filled 2020-04-12: qty 8
  Filled 2020-04-12 (×2): qty 3
  Filled 2020-04-12: qty 8
  Filled 2020-04-12: qty 3
  Filled 2020-04-12: qty 8

## 2020-04-12 NOTE — Progress Notes (Signed)
PROGRESS NOTE  Tanner Berg:[id number] DOB: 06-14-1971 DOA: 04/08/2020 PCP: Ricardo Jericho, NP   LOS: 4 days   Brief narrative: As per HPI,  Tanner Berg is a 49 y.o. male with medical history significant for COVID-19 infection in February 2021, history of dental abscess, GERD, presented to the hospital with 4-day history of cough, hemoptysis, sore throat and night sweats.  He said he aspirated at night about 2 weeks prior to admission after he drank milk and cookies shortly before bedtime. He was found to have right lower lobe cavitary lesions concerning for multifocal lung abscess.  He was treated with empiric IV antibiotics.  ID and pulmonologist have been consulted to assist with management.  Assessment/Plan:  Principal Problem:   Cavitary pneumonia Active Problems:   Hemoptysis   Tobacco abuse   History of COVID-19   Dehydration  Fever/ right lower lobe cavitary pneumonia/multifocal abscess/hemoptysis: Continue IV Zosyn. Follow-up sputum AFB, sputum and blood cultures.  Pulmonary on board underwent bronchoscopy with bronchoalveolar lavage on 04/12/2020.  Follow ID and pulmonary input.  History of Covid pneumonia.  Denies any hemoptysis, chest pain.  Patient continues to feel much better with regards to his symptoms.  Multiple labs have been sent including vasculitis labs and will need to follow.  Spoke with pulmonary today.  Communicated with ID regarding further plan.  Volume depletion: Improved.    Off IV fluids   Insomnia: Trazodone as needed for sleep.   Tobacco use disorder: Continue nicotine patch  DVT prophylaxis: SCDs Start: 04/08/20 2021   Code Status: Full code  Family Communication: None  Status is: Inpatient  Remains inpatient appropriate because:IV treatments appropriate due to intensity of illness or inability to take PO, Inpatient level of care appropriate due to severity of illness and status post bronchoscopy with bronchoalveolar lavage, on  IV antibiotic.   Dispo: The patient is from: Home              Anticipated d/c is to: Home              Anticipated d/c date is: 2 to 3 days              Patient currently is not medically stable to d/c.:  Infectious disease recommendations on discharge,   Consultants:  Pulmonary  Infectious disease  Procedures:  Bronchoscopy with bronchoalveolar lavage on 04/11/2020  Antibiotics:  . Zosyn IV 10/18>  Subjective: Today patient feels much better in terms of breathing.  No chest pain fever hemoptysis.  Objective: Vitals:   04/11/20 2024 04/12/20 0612  BP: 121/80 114/75  Pulse: 88 71  Resp: 20 20  Temp: 98.5 F (36.9 C) 98.2 F (36.8 C)  SpO2: 99% 97%    Intake/Output Summary (Last 24 hours) at 04/12/2020 0731 Last data filed at 04/12/2020 0612 Gross per 24 hour  Intake 1072.6 ml  Output 1625 ml  Net -552.4 ml   Filed Weights   04/10/20 0141 04/11/20 0455 04/12/20 0612  Weight: 82.1 kg 82 kg 82.4 kg   Body mass index is 25.34 kg/m.   Physical Exam: GENERAL: Patient is alert awake and oriented. Not in obvious distress. HENT: No scleral pallor or icterus. Pupils equally reactive to light. Oral mucosa is moist.  Poor oral hygiene and dentition and dentition NECK: is supple, no gross swelling noted. CHEST:Diminished breath sounds bilaterally. CVS: S1 and S2 heard, no murmur. Regular rate and rhythm.  ABDOMEN: Soft, non-tender, bowel sounds are present. EXTREMITIES: No edema.  CNS:  Cranial nerves are intact. No focal motor deficits. SKIN: warm and dry without rashes.  Data Review: I have personally reviewed the following laboratory data and studies,  CBC: Recent Labs  Lab 04/08/20 1319  WBC 17.3*  18.2*  NEUTROABS 14.0*  HGB 14.1  14.5  HCT 41.2  41.7  MCV 89.8  87.6  PLT 419*  672   Basic Metabolic Panel: Recent Labs  Lab 04/08/20 1319 04/08/20 2051 04/09/20 0527  NA 135  --   --   K 3.7  --   --   CL 99  --   --   CO2 24  --   --     GLUCOSE 121*  --   --   BUN 14  --   --   CREATININE 0.94  --  0.79  CALCIUM 8.5*  --   --   MG  --  2.1  --   PHOS  --   --  2.7   Liver Function Tests: Recent Labs  Lab 04/08/20 1924  AST 28  ALT 44  ALKPHOS 86  BILITOT 0.8  PROT 8.5*  ALBUMIN 3.7   No results for input(s): LIPASE, AMYLASE in the last 168 hours. No results for input(s): AMMONIA in the last 168 hours. Cardiac Enzymes: Recent Labs  Lab 04/08/20 2051  CKTOTAL 101   BNP (last 3 results) No results for input(s): BNP in the last 8760 hours.  ProBNP (last 3 results) No results for input(s): PROBNP in the last 8760 hours.  CBG: No results for input(s): GLUCAP in the last 168 hours. Recent Results (from the past 240 hour(s))  Acid Fast Smear (AFB)     Status: None   Collection Time: 04/08/20  3:14 AM   Specimen: Sputum  Result Value Ref Range Status   AFB Specimen Processing Concentration  Final   Acid Fast Smear Negative  Final    Comment: (NOTE) Performed At: [facility] [address] [place], [place] [id number] Rush Farmer MD ZM:[id number]    Source (AFB) SPUTUM  Final    Comment: Performed at [facility], [address] [place], [place] [address]  Respiratory Panel by RT PCR (Flu A&B, Covid) - Nasopharyngeal Swab     Status: None   Collection Time: 04/08/20  7:23 PM   Specimen: Nasopharyngeal Swab  Result Value Ref Range Status   SARS Coronavirus 2 by RT PCR NEGATIVE NEGATIVE Final    Comment: (NOTE) SARS-CoV-2 target nucleic acids are NOT DETECTED.  The SARS-CoV-2 RNA is generally detectable in upper respiratoy specimens during the acute phase of infection. The lowest concentration of SARS-CoV-2 viral copies this assay can detect is 131 copies/mL. A negative result does not preclude SARS-Cov-2 infection and should not be used as the sole basis for treatment or other patient management decisions. A negative result may occur with  improper specimen  collection/handling, submission of specimen other than nasopharyngeal swab, presence of viral mutation(s) within the areas targeted by this assay, and inadequate number of viral copies (<131 copies/mL). A negative result must be combined with clinical observations, patient history, and epidemiological information. The expected result is Negative.  Fact Sheet for Patients:  PinkCheek.be  Fact Sheet for Healthcare Providers:  GravelBags.it  This test is no t yet approved or cleared by the [place] FDA and  has been authorized for detection and/or diagnosis of SARS-CoV-2 by FDA under an Emergency Use Authorization (EUA). This EUA will remain  in effect (meaning this test  can be used) for the duration of the COVID-19 declaration under Section 564(b)(1) of the Act, 21 U.S.C. section 360bbb-3(b)(1), unless the authorization is terminated or revoked sooner.     Influenza A by PCR NEGATIVE NEGATIVE Final   Influenza B by PCR NEGATIVE NEGATIVE Final    Comment: (NOTE) The Xpert Xpress SARS-CoV-2/FLU/RSV assay is intended as an aid in  the diagnosis of influenza from Nasopharyngeal swab specimens and  should not be used as a sole basis for treatment. Nasal washings and  aspirates are unacceptable for Xpert Xpress SARS-CoV-2/FLU/RSV  testing.  Fact Sheet for Patients: PinkCheek.be  Fact Sheet for Healthcare Providers: GravelBags.it  This test is not yet approved or cleared by the [place] FDA and  has been authorized for detection and/or diagnosis of SARS-CoV-2 by  FDA under an Emergency Use Authorization (EUA). This EUA will remain  in effect (meaning this test can be used) for the duration of the  Covid-19 declaration under Section 564(b)(1) of the Act, 21  U.S.C. section 360bbb-3(b)(1), unless the authorization is  terminated or revoked. Performed at [facility], [address] [place], [place] [address]   Blood culture (routine x 2)     Status: None (Preliminary result)   Collection Time: 04/08/20  8:51 PM   Specimen: BLOOD  Result Value Ref Range Status   Specimen Description BLOOD RIGHT [facility]  Final   Special Requests   Final    BOTTLES DRAWN AEROBIC AND ANAEROBIC Blood Culture adequate volume   Culture   Final    NO GROWTH 4 DAYS Performed at [facility], [address] [place], [place] [address]    Report Status PENDING  Incomplete  Blood culture (routine x 2)     Status: None (Preliminary result)   Collection Time: 04/08/20  8:51 PM   Specimen: BLOOD LEFT HAND  Result Value Ref Range Status   Specimen Description BLOOD LEFT HAND  Final   Special Requests   Final    BOTTLES DRAWN AEROBIC AND ANAEROBIC Blood Culture adequate volume   Culture   Final    NO GROWTH 4 DAYS Performed at [facility], [address] [place], [place] [address]    Report Status PENDING  Incomplete  MRSA PCR Screening     Status: None   Collection Time: 04/08/20 10:07 PM   Specimen: Nasal Mucosa; Nasopharyngeal  Result Value Ref Range Status   MRSA by PCR NEGATIVE NEGATIVE Final    Comment:        The GeneXpert MRSA Assay (FDA approved for NASAL specimens only), is one component of a comprehensive MRSA colonization surveillance program. It is not intended to diagnose MRSA infection nor to guide or monitor treatment for MRSA infections. Performed at [facility], [address] [place], [place] [address]   Expectorated sputum assessment w rflx to resp cult     Status: None   Collection Time: 04/09/20  3:14 AM   Specimen: Sputum  Result Value Ref Range Status   Specimen Description EXPECTORATED SPUTUM  Final   Special Requests Immunocompromised  Final   Sputum evaluation   Final    THIS SPECIMEN IS ACCEPTABLE FOR SPUTUM CULTURE Performed at [facility], [address], [place], [place]  [address]    Report Status 04/09/2020 FINAL  Final  Culture, respiratory     Status: None   Collection Time: 04/09/20  3:14 AM  Result Value Ref Range Status   Specimen Description  Final    EXPECTORATED SPUTUM Performed at [facility], [address] [place], [place] [address]    Special Requests   Final    Immunocompromised Reflexed from [phone] Performed at [facility], [address] [place], [place] [address]    Gram Stain   Final    FEW SQUAMOUS EPITHELIAL CELLS PRESENT MODERATE WBC PRESENT,BOTH PMN AND MONONUCLEAR FEW GRAM POSITIVE COCCI FEW GRAM NEGATIVE RODS    Culture   Final    RARE Normal respiratory flora-no Staph aureus or Pseudomonas seen Performed at [facility], [place] [address], [place],  [place] [address]    Report Status 04/11/2020 FINAL  Final  Culture, bal-quantitative     Status: None (Preliminary result)   Collection Time: 04/11/20  9:04 AM   Specimen: Bronchoalveolar Lavage; Respiratory  Result Value Ref Range Status   Specimen Description   Final    BRONCHIAL ALVEOLAR LAVAGE Performed at [facility], [address], [place], [place] [address]    Special Requests   Final    NONE Performed at [facility], [address] [place], [place] [address]    Gram Stain   Final    MODERATE WBC PRESENT, PREDOMINANTLY PMN NO ORGANISMS SEEN Performed at [facility], [place] [address], [place], [place] [address]    Culture PENDING  Incomplete   Report Status PENDING  Incomplete  KOH prep     Status: None   Collection Time: 04/11/20  9:04 AM   Specimen: Bronchoalveolar Lavage  Result Value Ref Range Status   Specimen Description BRONCHIAL ALVEOLAR LAVAGE  Final   Special Requests NONE  Final   KOH Prep   Final    NO YEAST OR FUNGAL ELEMENTS SEEN Performed at [facility], [address], [place],  [address]    Report Status 04/11/2020 FINAL  Final     Studies: No results  found.    Flora Lipps, MD  [facility] 04/12/2020

## 2020-04-12 NOTE — Progress Notes (Signed)
ID PT FEELING BETTER But feeling a little down as none of his family members are calling him Patient Vitals for the past 24 hrs:  BP Temp Temp src Pulse Resp SpO2 Weight  04/12/20 1715 108/69 98.4 F (36.9 C) Oral 74 16 100 % --  04/12/20 1243 125/75 98 F (36.7 C) Oral 77 16 100 % --  04/12/20 0839 (!) 105/58 98.1 F (36.7 C) Oral 62 16 100 % --  04/12/20 0612 114/75 98.2 F (36.8 C) Oral 71 20 97 % 82.4 kg  04/11/20 2024 121/80 98.5 F (36.9 C) Oral 88 20 99 % --    Awake and alert Very poor denition Chest bilateral air entry Crepitations and rhonchi on the right side is less Heart sounds S1-S2 Abdomen soft CNS non focal  Labs CBC Latest Ref Rng & Units 04/08/2020 04/08/2020 10/05/2017  WBC 4.0 - 10.5 K/uL 17.3(H) 18.2(H) 10.9(H)  Hemoglobin 13.0 - 17.0 g/dL 79.4 80.1 65.5  Hematocrit 39 - 52 % 41.2 41.7 47.2  Platelets 150 - 400 K/uL 419(H) 381 276    CMP Latest Ref Rng & Units 04/09/2020 04/08/2020 10/05/2017  Glucose 70 - 99 mg/dL - 374(M) 270(B)  BUN 6 - 20 mg/dL - 14 13  Creatinine 8.67 - 1.24 mg/dL 5.44 9.20 1.00  Sodium 135 - 145 mmol/L - 135 138  Potassium 3.5 - 5.1 mmol/L - 3.7 3.9  Chloride 98 - 111 mmol/L - 99 104  CO2 22 - 32 mmol/L - 24 27  Calcium 8.9 - 10.3 mg/dL - 8.5(L) 9.0  Total Protein 6.5 - 8.1 g/dL - 8.5(H) -  Total Bilirubin 0.3 - 1.2 mg/dL - 0.8 -  Alkaline Phos 38 - 126 U/L - 86 -  AST 15 - 41 U/L - 28 -  ALT 0 - 44 U/L - 44 -    Microbiology Sputum culture negative so far Sputum AFB smear is negative Blood culture negative   Impression and recommendation  Lung abscesses on the right lobe.  Thick-walled Air-fluid level  underwent bronchoscopy and bronchoalveolar fluid was sent for bacterial, fungal and AFB cultures. The etiology of the lung abscesses could be aspiration leading to necrotizing pneumonia complicated by lung abscesses versus other bacterial infection .  Tuberculosis unlikely and also  neg smear- So DC airborne  Zosyn  switched to unasyn  culture no growth so far May repeat CXr to look for change As he is doing very well, after discussing with Dr.Aleskerov he could be discharged on PO augmentin 875mg  BID for 4 weeks with probiotic Follow up as Op , follow up cultures  Poor dentition- he will need complete extraction as OP  Discussed the management with the patient and care team  Dr.Fitzgerald is available tomorrow- call if needed

## 2020-04-12 NOTE — Progress Notes (Signed)
Pulmonary Medicine          Date: 04/12/2020,   MRN# [id number] Tanner Berg [birth year]     AdmissionWeight: 77.1 kg                 CurrentWeight: 82.4 kg  Referring physician: Dr Mal Misty    CHIEF COMPLAINT:   Multiple cavitary lesions of right lung with hilar and mediastinal lymphadenopathy   HISTORY OF PRESENT ILLNESS   88 M with PMH as below who came in after sub-acute course of cough with recurrent non-massive hemoptysis. Reports subjective fevers but no specific sick contacts , does admit to malaise, and flu like illness. Had COVID19 07/2019. Returned to ED due to recurrent cough with blood streaked expectorate. Patient found to have leukocytosis and pneumonia on initial screening CXR in ED. He was admitted to hospitalist [facility] with empiric therapy for CAP. He was evaluated by ID specialist. He had respiratory cultures done with mixed flora growing on background on inflammatory cells.  Follow up CT chest imaging was done showing right lower lobe thick walled cavitary lesion with air-fluid level consistent with abscess.  Patient does have poor dentition and does admit to regurgitation with GERD hx. He is still febrile and has been treated with IV Zosyn however seems to have very little appreciable improvement.  Case discussed with infectious disease specialist who raises concern regarding poor response to therapy with subsequent request for bronchoscopic evaluation and invasive specimen to rule out other etiologies such as maignancy , rare indolent infections such as nocardia, fungal etiology, vasculiltides.  Family hx of cancer includes prostate cancer on both sides.  Denies hx of autoimmune disease hx, has hx of DM family hx.  He takes asprin last pill 1 wk ago. He actively smokes 1/2 pack daily.    04/11/20- patient resting in bed this am states he slept well.  He is on room air, has phelgm this morning on expectoration and rhonchi posteriorly on auscultation of right  lung. Plan for bronchoscopy this am.   04/12/20- patient is stable on room air.  From pulmonary standpoint patient can be treated on outpatient and continue his therapy.  He feels better and is not coughing at this time.  He works with [employer] in [employer] and we discussed going back to work next week.    PAST MEDICAL HISTORY   No past medical history on file.   SURGICAL HISTORY   History reviewed. No pertinent surgical history.   FAMILY HISTORY   Family History  Problem Relation Age of Onset  . Hypertension Other      SOCIAL HISTORY   Social History   Tobacco Use  . Smoking status: Current Every Day Smoker    Packs/day: 1.00    Types: Cigarettes  . Smokeless tobacco: Never Used  Vaping Use  . Vaping Use: Never used  Substance Use Topics  . Alcohol use: No  . Drug use: Never     MEDICATIONS    Home Medication:    Current Medication:  Current Facility-Administered Medications:  .  0.9 %  sodium chloride infusion, , Intravenous, PRN, Doutova, Anastassia, MD, Last Rate: 12 mL/hr at 04/11/20 0826, New Bag at 04/11/20 0922 .  acetaminophen (TYLENOL) tablet 650 mg, 650 mg, Oral, Q6H PRN, 650 mg at 04/10/20 2335 **OR** acetaminophen (TYLENOL) suppository 650 mg, 650 mg, Rectal, Q6H PRN, Doutova, Anastassia, MD .  butamben-tetracaine-benzocaine (CETACAINE) spray 1 spray, 1 spray, Topical, Once, Ottie Glazier, MD .  guaiFENesin-dextromethorphan (ROBITUSSIN DM) 100-10 MG/5ML syrup 5 mL, 5 mL, Oral, Q4H PRN, Athena Masse, MD, 5 mL at 04/09/20 0548 .  HYDROcodone-acetaminophen (NORCO/VICODIN) 5-325 MG per tablet 1-2 tablet, 1-2 tablet, Oral, Q4H PRN, Toy Baker, MD, 2 tablet at 04/09/20 2105 .  lidocaine (XYLOCAINE) 2 % jelly 1 application, 1 application, Topical, Once, Kaiyla Stahly, MD .  nicotine (NICODERM CQ - dosed in mg/24 hours) patch 21 mg, 21 mg, Transdermal, Daily, Doutova, Anastassia, MD, 21 mg at 04/12/20 0904 .  phenylephrine  (NEO-SYNEPHRINE) 0.25 % nasal spray 1 spray, 1 spray, Each Nare, Q6H PRN, Lanney Gins, Treacy Holcomb, MD .  piperacillin-tazobactam (ZOSYN) IVPB 3.375 g, 3.375 g, Intravenous, Q8H, Doutova, Anastassia, MD, Last Rate: 12.5 mL/hr at 04/12/20 0624, 3.375 g at 04/12/20 0624 .  traZODone (DESYREL) tablet 25 mg, 25 mg, Oral, QHS, Jennye Boroughs, MD, 25 mg at 04/11/20 2254    ALLERGIES   Patient has no known allergies.     REVIEW OF SYSTEMS    Review of Systems:  Gen:  Denies  fever, sweats, chills weigh loss  HEENT: Denies blurred vision, double vision, ear pain, eye pain, hearing loss, nose bleeds, sore throat Cardiac:  No dizziness, chest pain or heaviness, chest tightness,edema Resp:   Denies cough or sputum porduction, shortness of breath,wheezing, hemoptysis,  Gi: Denies swallowing difficulty, stomach pain, nausea or vomiting, diarrhea, constipation, bowel incontinence Gu:  Denies bladder incontinence, burning urine Ext:   Denies Joint pain, stiffness or swelling Skin: Denies  skin rash, easy bruising or bleeding or hives Endoc:  Denies polyuria, polydipsia , polyphagia or weight change Psych:   Denies depression, insomnia or hallucinations   Other:  All other systems negative   VS: BP (!) 105/58 (BP Location: Right Arm)   Pulse 62   Temp 98.1 F (36.7 C) (Oral)   Resp 16   Ht _0  (1.803 m)   Wt 82.4 kg   SpO2 100%   BMI 25.34 kg/m      PHYSICAL EXAM    GENERAL:NAD, no fevers, chills, no weakness no fatigue HEAD: Normocephalic, atraumatic.  EYES: Pupils equal, round, reactive to light. Extraocular muscles intact. No scleral icterus.  MOUTH: Moist mucosal membrane. Dentition intact. No abscess noted.  EAR, NOSE, THROAT: Clear without exudates. No external lesions.  NECK: Supple. No thyromegaly. No nodules. No JVD.  PULMONARY: clear to auscultation bilaterally  CARDIOVASCULAR: S1 and S2. Regular rate and rhythm. No murmurs, rubs, or gallops. No edema. Pedal pulses 2+  bilaterally.  GASTROINTESTINAL: Soft, nontender, nondistended. No masses. Positive bowel sounds. No hepatosplenomegaly.  MUSCULOSKELETAL: No swelling, clubbing, or edema. Range of motion full in all extremities.  NEUROLOGIC: Cranial nerves II through XII are intact. No gross focal neurological deficits. Sensation intact. Reflexes intact.  SKIN: No ulceration, lesions, rashes, or cyanosis. Skin warm and dry. Turgor intact.  PSYCHIATRIC: Mood, affect within normal limits. The patient is awake, alert and oriented x 3. Insight, judgment intact.       IMAGING    DG Chest 2 View  Result Date: 04/08/2020 CLINICAL DATA:  Hemoptysis.  Sore throat and sweating. EXAM: CHEST - 2 VIEW COMPARISON:  Report from chest radiograph 11/14/2003 FINDINGS: Abnormal opacities in the right chest including a 5.7 by 4.8 cm opacity in the right mid lung with central lucency, and a 5.2 by 3.2 cm right basilar opacity with central lucency. Possibilities may include cavitary pneumonia or malignancy. No blunting of the costophrenic angles. Cardiac and mediastinal margins appear normal. IMPRESSION: 1.  Abnormal opacities with central lucencies in the right chest, differential diagnostic considerations include cavitary pneumonia or malignancy. Chest CT is recommended for further characterization. 2. No pleural effusion or airspace opacity observed. Electronically Signed   By: Van Clines M.D.   On: 04/08/2020 15:29   CT Angio Chest PE W/Cm &/Or Wo Cm  Result Date: 04/08/2020 CLINICAL DATA:  Cavitary lesions on recent chest x-ray EXAM: CT ANGIOGRAPHY CHEST WITH CONTRAST TECHNIQUE: Multidetector CT imaging of the chest was performed using the standard protocol during bolus administration of intravenous contrast. Multiplanar CT image reconstructions and MIPs were obtained to evaluate the vascular anatomy. CONTRAST:  7m OMNIPAQUE IOHEXOL 350 MG/ML SOLN COMPARISON:  Chest x-ray from earlier in the same day. FINDINGS:  Cardiovascular: Thoracic aorta and its branches are within normal limits. No aneurysmal dilatation or dissection is seen. Pulmonary artery shows a normal branching pattern. No filling defect to suggest pulmonary embolism is seen. No coronary calcifications are noted Mediastinum/Nodes: Thoracic inlet is unremarkable. 9 mm short axis lymph node is noted in the right paratracheal region. 14 mm short axis subcarinal node is seen. Right hilar lymph nodes are noted. The largest of these measures 11 mm in short axis. The esophagus as visualized is within normal limits. Lungs/Pleura: Left lung is well aerated. No focal infiltrate or sizable effusion is seen. There are 2 cavitary lesions identified within the right lower lobe. The more lateral of these measures 3.9 x 3.6 cm. Air-fluid level is noted within. Slightly more superior and medial there is a 3.9 x 3.3 cm cavitary lesion with air-fluid level within. No sizable effusion or other focal parenchymal abnormality is noted. Upper Abdomen: Visualized upper abdomen shows fatty infiltration of the liver. Musculoskeletal: Degenerative changes of the thoracic spine are noted. No acute abnormality noted. Review of the MIP images confirms the above findings. IMPRESSION: Cavitary lesions within the right lower lobe with reactive lymphadenopathy. These changes likely represent multifocal lung abscesses although the possibility of neoplasm cannot be totally excluded. Follow-up CT following appropriate therapy is recommended. Electronically Signed   By: MInez CatalinaM.D.   On: 04/08/2020 18:14   ECHOCARDIOGRAM COMPLETE  Result Date: 04/09/2020    ECHOCARDIOGRAM REPORT   Patient Name:   SROSSIE BRETADODate of Exam: 04/09/2020 Medical Rec #:  [id number]  Height:       70.0 in Accession #:    [id number] Weight:       170.0 lb Date of Birth:  912-Sep-1972  BSA:          1.948 m Patient Age:    89years    BP:           104/65 mmHg Patient Gender: M           HR:           83 bpm. Exam  Location:  [facility] Procedure: 2D Echo, Color Doppler and Cardiac Doppler Indications:     R50.9 Fever  History:         Patient has no prior history of Echocardiogram examinations. Pt                  tested positive for COVID-19 on 08/10/19.  Sonographer:     JCharmayne SheerRDCS (AE) Referring Phys:  [email or web address] Phys: MKathlyn SacramentoMD IMPRESSIONS  1. Left ventricular ejection fraction, by estimation, is 55 to 60%. The left ventricle has normal function. The left ventricle has no regional wall motion abnormalities.  Left ventricular diastolic parameters were normal.  2. Right ventricular systolic function is normal. The right ventricular size is normal. Tricuspid regurgitation signal is inadequate for assessing PA pressure.  3. The mitral valve is normal in structure. No evidence of mitral valve regurgitation. No evidence of mitral stenosis.  4. The aortic valve is normal in structure. Aortic valve regurgitation is not visualized. No aortic stenosis is present.  5. The inferior vena cava is normal in size with greater than 50% respiratory variability, suggesting right atrial pressure of 3 mmHg. Conclusion(s)/Recommendation(s): No evidence of valvular vegetations on this transthoracic echocardiogram. FINDINGS  Left Ventricle: Left ventricular ejection fraction, by estimation, is 55 to 60%. The left ventricle has normal function. The left ventricle has no regional wall motion abnormalities. The left ventricular internal cavity size was normal in size. There is  no left ventricular hypertrophy. Left ventricular diastolic parameters were normal. Right Ventricle: The right ventricular size is normal. No increase in right ventricular wall thickness. Right ventricular systolic function is normal. Tricuspid regurgitation signal is inadequate for assessing PA pressure. Left Atrium: Left atrial size was normal in size. Right Atrium: Right atrial size was normal in size. Pericardium: There is no evidence of  pericardial effusion. Mitral Valve: The mitral valve is normal in structure. No evidence of mitral valve regurgitation. No evidence of mitral valve stenosis. MV peak gradient, 5.1 mmHg. The mean mitral valve gradient is 3.0 mmHg. Tricuspid Valve: The tricuspid valve is normal in structure. Tricuspid valve regurgitation is trivial. No evidence of tricuspid stenosis. Aortic Valve: The aortic valve is normal in structure. Aortic valve regurgitation is not visualized. No aortic stenosis is present. Aortic valve mean gradient measures 5.0 mmHg. Aortic valve peak gradient measures 10.5 mmHg. Aortic valve area, by VTI measures 2.42 cm. Pulmonic Valve: The pulmonic valve was normal in structure. Pulmonic valve regurgitation is not visualized. No evidence of pulmonic stenosis. Aorta: The aortic root is normal in size and structure. Venous: The inferior vena cava is normal in size with greater than 50% respiratory variability, suggesting right atrial pressure of 3 mmHg. IAS/Shunts: No atrial level shunt detected by color flow Doppler.  LEFT VENTRICLE PLAX 2D LVIDd:         4.66 cm  Diastology LVIDs:         3.43 cm  LV e' medial:    8.70 cm/s LV PW:         0.91 cm  LV E/e' medial:  10.7 LV IVS:        0.77 cm  LV e' lateral:   13.60 cm/s LVOT diam:     2.10 cm  LV E/e' lateral: 6.8 LV SV:         65 LV SV Index:   34 LVOT Area:     3.46 cm  RIGHT VENTRICLE RV Basal diam:  2.57 cm LEFT ATRIUM             Index       RIGHT ATRIUM           Index LA diam:        2.70 cm 1.39 cm/m  RA Area:     10.90 cm LA Vol (A2C):   33.4 ml 17.15 ml/m RA Volume:   21.60 ml  11.09 ml/m LA Vol (A4C):   21.3 ml 10.93 ml/m LA Biplane Vol: 28.6 ml 14.68 ml/m  AORTIC VALVE  PULMONIC VALVE AV Area (Vmax):    2.52 cm     PV Vmax:       1.22 m/s AV Area (Vmean):   2.32 cm     PV Vmean:      81.900 cm/s AV Area (VTI):     2.42 cm     PV VTI:        0.237 m AV Vmax:           162.00 cm/s  PV Peak grad:  6.0 mmHg AV Vmean:           105.000 cm/s PV Mean grad:  3.0 mmHg AV VTI:            0.270 m AV Peak Grad:      10.5 mmHg AV Mean Grad:      5.0 mmHg LVOT Vmax:         118.00 cm/s LVOT Vmean:        70.400 cm/s LVOT VTI:          0.189 m LVOT/AV VTI ratio: 0.70  AORTA Ao Root diam: 3.00 cm MITRAL VALVE MV Area (PHT): 5.54 cm    SHUNTS MV Peak grad:  5.1 mmHg    Systemic VTI:  0.19 m MV Mean grad:  3.0 mmHg    Systemic Diam: 2.10 cm MV Vmax:       1.13 m/s MV Vmean:      76.8 cm/s MV Decel Time: 137 msec MV E velocity: 93.00 cm/s MV A velocity: 74.60 cm/s MV E/A ratio:  1.25 Kathlyn Sacramento MD Electronically signed by Kathlyn Sacramento MD Signature Date/Time: 04/09/2020/12:37:37 PM    Final        ASSESSMENT/PLAN   Right lower lobe cavitary lesions - with poor response to empiric IV Zosyn  - associated with hilar/mediastinal lymphadenopathy - as per radiology cannot rule out malignancy  - possible inflammatory vascultitis related - may need biopsy - endobronchial/ transbronchial -fungitell -respiratory culture - preliminary mixed species -agree that odds favor aspiration as etiology  -noted additional microbiology pending - Gold Quant -will add ANA and ANCA -sputum cytology  -Infectious disease on case - appreciate input -Antimicrobials as per ID  -s/p bronchoscopy - BAL results still pending       Thank you for allowing me to participate in the care of this patient.   Patient/Family are satisfied with care plan and all questions have been answered.   This document was prepared using Dragon voice recognition software and may include unintentional dictation errors.     Ottie Glazier, M.D.  Division of [place]

## 2020-04-13 LAB — CBC WITH DIFFERENTIAL/PLATELET
Abs Immature Granulocytes: 0.05 10*3/uL (ref 0.00–0.07)
Basophils Absolute: 0 10*3/uL (ref 0.0–0.1)
Basophils Relative: 0 %
Eosinophils Absolute: 0.2 10*3/uL (ref 0.0–0.5)
Eosinophils Relative: 2 %
HCT: 35 % — ABNORMAL LOW (ref 39.0–52.0)
Hemoglobin: 12 g/dL — ABNORMAL LOW (ref 13.0–17.0)
Immature Granulocytes: 1 %
Lymphocytes Relative: 33 %
Lymphs Abs: 3.3 10*3/uL (ref 0.7–4.0)
MCH: 30.5 pg (ref 26.0–34.0)
MCHC: 34.3 g/dL (ref 30.0–36.0)
MCV: 88.8 fL (ref 80.0–100.0)
Monocytes Absolute: 0.6 10*3/uL (ref 0.1–1.0)
Monocytes Relative: 6 %
Neutro Abs: 6 10*3/uL (ref 1.7–7.7)
Neutrophils Relative %: 58 %
Platelets: 552 10*3/uL — ABNORMAL HIGH (ref 150–400)
RBC: 3.94 MIL/uL — ABNORMAL LOW (ref 4.22–5.81)
RDW: 12.8 % (ref 11.5–15.5)
WBC: 10.2 10*3/uL (ref 4.0–10.5)
nRBC: 0 % (ref 0.0–0.2)

## 2020-04-13 LAB — CULTURE, BLOOD (ROUTINE X 2)
Culture: NO GROWTH
Culture: NO GROWTH
Special Requests: ADEQUATE
Special Requests: ADEQUATE

## 2020-04-13 LAB — FUNGITELL, SERUM: Fungitell Result: <31 pg/mL (ref <80)

## 2020-04-13 MED ORDER — AMOXICILLIN-POT CLAVULANATE 875-125 MG PO TABS: 1.0000 | ORAL_TABLET | Freq: Two times a day (BID) | ORAL | 0 refills | Status: AC

## 2020-04-13 MED ORDER — NICOTINE 21 MG/24HR TD PT24: 21.0000 mg | MEDICATED_PATCH | Freq: Every day | TRANSDERMAL | 0 refills | Status: AC

## 2020-04-13 NOTE — Progress Notes (Signed)
Pulmonary Medicine          Date: 04/13/2020,   MRN# [id number] Tanner Berg [birth year]     AdmissionWeight: 77.1 kg                 CurrentWeight: 82.4 kg  Referring physician: Dr Mal Misty    CHIEF COMPLAINT:   Multiple cavitary lesions of right lung with hilar and mediastinal lymphadenopathy   HISTORY OF PRESENT ILLNESS   61 M with PMH as below who came in after sub-acute course of cough with recurrent non-massive hemoptysis. Reports subjective fevers but no specific sick contacts , does admit to malaise, and flu like illness. Had COVID19 07/2019. Returned to ED due to recurrent cough with blood streaked expectorate. Patient found to have leukocytosis and pneumonia on initial screening CXR in ED. He was admitted to hospitalist [facility] with empiric therapy for CAP. He was evaluated by ID specialist. He had respiratory cultures done with mixed flora growing on background on inflammatory cells.  Follow up CT chest imaging was done showing right lower lobe thick walled cavitary lesion with air-fluid level consistent with abscess.  Patient does have poor dentition and does admit to regurgitation with GERD hx. He is still febrile and has been treated with IV Zosyn however seems to have very little appreciable improvement.  Case discussed with infectious disease specialist who raises concern regarding poor response to therapy with subsequent request for bronchoscopic evaluation and invasive specimen to rule out other etiologies such as maignancy , rare indolent infections such as nocardia, fungal etiology, vasculiltides.  Family hx of cancer includes prostate cancer on both sides.  Denies hx of autoimmune disease hx, has hx of DM family hx.  He takes asprin last pill 1 wk ago. He actively smokes 1/2 pack daily.    04/11/20- patient resting in bed this am states he slept well.  He is on room air, has phelgm this morning on expectoration and rhonchi posteriorly on auscultation of right  lung. Plan for bronchoscopy this am.   04/12/20- patient is stable on room air.  From pulmonary standpoint patient can be treated on outpatient and continue his therapy.  He feels better and is not coughing at this time.  He works with [employer] in [employer] and we discussed going back to work next week.   04/13/20- patient feeling well. He has plans for follow up with [name] his primary provider and we will make arrangements to see him in pulmonary clinic within 7 days.    PAST MEDICAL HISTORY   No past medical history on file.   SURGICAL HISTORY   Past Surgical History:  Procedure Laterality Date  . FLEXIBLE BRONCHOSCOPY Right 04/11/2020   Procedure: FLEXIBLE BRONCHOSCOPY;  Surgeon: Ottie Glazier, MD;  Location: [facility] ORS;  Service: Thoracic;  Laterality: Right;     FAMILY HISTORY   Family History  Problem Relation Age of Onset  . Hypertension Other      SOCIAL HISTORY   Social History   Tobacco Use  . Smoking status: Current Every Day Smoker    Packs/day: 1.00    Types: Cigarettes  . Smokeless tobacco: Never Used  Vaping Use  . Vaping Use: Never used  Substance Use Topics  . Alcohol use: No  . Drug use: Never     MEDICATIONS    Home Medication:    Current Medication:  Current Facility-Administered Medications:  .  0.9 %  sodium chloride infusion, , Intravenous,  PRN, Toy Baker, MD, Last Rate: 12 mL/hr at 04/11/20 0826, New Bag at 04/11/20 0922 .  acetaminophen (TYLENOL) tablet 650 mg, 650 mg, Oral, Q6H PRN, 650 mg at 04/10/20 2335 **OR** acetaminophen (TYLENOL) suppository 650 mg, 650 mg, Rectal, Q6H PRN, Doutova, Anastassia, MD .  Ampicillin-Sulbactam (UNASYN) 3 g in sodium chloride 0.9 % 100 mL IVPB, 3 g, Intravenous, Q6H, Ravishankar, Jayashree, MD, Last Rate: 200 mL/hr at 04/13/20 0614, 3 g at 04/13/20 0614 .  butamben-tetracaine-benzocaine (CETACAINE) spray 1 spray, 1 spray, Topical, Once, Citlalli Weikel, MD .   guaiFENesin-dextromethorphan (ROBITUSSIN DM) 100-10 MG/5ML syrup 5 mL, 5 mL, Oral, Q4H PRN, Athena Masse, MD, 5 mL at 04/12/20 1846 .  HYDROcodone-acetaminophen (NORCO/VICODIN) 5-325 MG per tablet 1-2 tablet, 1-2 tablet, Oral, Q4H PRN, Toy Baker, MD, 2 tablet at 04/09/20 2105 .  lidocaine (XYLOCAINE) 2 % jelly 1 application, 1 application, Topical, Once, Khori Underberg, MD .  nicotine (NICODERM CQ - dosed in mg/24 hours) patch 21 mg, 21 mg, Transdermal, Daily, Doutova, Anastassia, MD, 21 mg at 04/12/20 0904 .  phenylephrine (NEO-SYNEPHRINE) 0.25 % nasal spray 1 spray, 1 spray, Each Nare, Q6H PRN, Ottie Glazier, MD .  traZODone (DESYREL) tablet 25 mg, 25 mg, Oral, QHS, Jennye Boroughs, MD, 25 mg at 04/11/20 2254    ALLERGIES   Patient has no known allergies.     REVIEW OF SYSTEMS    Review of Systems:  Gen:  Denies  fever, sweats, chills weigh loss  HEENT: Denies blurred vision, double vision, ear pain, eye pain, hearing loss, nose bleeds, sore throat Cardiac:  No dizziness, chest pain or heaviness, chest tightness,edema Resp:   Denies cough or sputum porduction, shortness of breath,wheezing, hemoptysis,  Gi: Denies swallowing difficulty, stomach pain, nausea or vomiting, diarrhea, constipation, bowel incontinence Gu:  Denies bladder incontinence, burning urine Ext:   Denies Joint pain, stiffness or swelling Skin: Denies  skin rash, easy bruising or bleeding or hives Endoc:  Denies polyuria, polydipsia , polyphagia or weight change Psych:   Denies depression, insomnia or hallucinations   Other:  All other systems negative   VS: BP 125/66 (BP Location: Left Arm)   Pulse 66   Temp 98 F (36.7 C) (Oral)   Resp 18   Ht _0  (1.803 m)   Wt 82.4 kg   SpO2 98%   BMI 25.34 kg/m      PHYSICAL EXAM    GENERAL:NAD, no fevers, chills, no weakness no fatigue HEAD: Normocephalic, atraumatic.  EYES: Pupils equal, round, reactive to light. Extraocular muscles  intact. No scleral icterus.  MOUTH: Moist mucosal membrane. Dentition intact. No abscess noted.  EAR, NOSE, THROAT: Clear without exudates. No external lesions.  NECK: Supple. No thyromegaly. No nodules. No JVD.  PULMONARY: clear to auscultation bilaterally  CARDIOVASCULAR: S1 and S2. Regular rate and rhythm. No murmurs, rubs, or gallops. No edema. Pedal pulses 2+ bilaterally.  GASTROINTESTINAL: Soft, nontender, nondistended. No masses. Positive bowel sounds. No hepatosplenomegaly.  MUSCULOSKELETAL: No swelling, clubbing, or edema. Range of motion full in all extremities.  NEUROLOGIC: Cranial nerves II through XII are intact. No gross focal neurological deficits. Sensation intact. Reflexes intact.  SKIN: No ulceration, lesions, rashes, or cyanosis. Skin warm and dry. Turgor intact.  PSYCHIATRIC: Mood, affect within normal limits. The patient is awake, alert and oriented x 3. Insight, judgment intact.       IMAGING    DG Chest 2 View  Result Date: 04/08/2020 CLINICAL DATA:  Hemoptysis.  Sore throat  and sweating. EXAM: CHEST - 2 VIEW COMPARISON:  Report from chest radiograph 11/14/2003 FINDINGS: Abnormal opacities in the right chest including a 5.7 by 4.8 cm opacity in the right mid lung with central lucency, and a 5.2 by 3.2 cm right basilar opacity with central lucency. Possibilities may include cavitary pneumonia or malignancy. No blunting of the costophrenic angles. Cardiac and mediastinal margins appear normal. IMPRESSION: 1. Abnormal opacities with central lucencies in the right chest, differential diagnostic considerations include cavitary pneumonia or malignancy. Chest CT is recommended for further characterization. 2. No pleural effusion or airspace opacity observed. Electronically Signed   By: Van Clines M.D.   On: 04/08/2020 15:29   CT Angio Chest PE W/Cm &/Or Wo Cm  Result Date: 04/08/2020 CLINICAL DATA:  Cavitary lesions on recent chest x-ray EXAM: CT ANGIOGRAPHY CHEST  WITH CONTRAST TECHNIQUE: Multidetector CT imaging of the chest was performed using the standard protocol during bolus administration of intravenous contrast. Multiplanar CT image reconstructions and MIPs were obtained to evaluate the vascular anatomy. CONTRAST:  68m OMNIPAQUE IOHEXOL 350 MG/ML SOLN COMPARISON:  Chest x-ray from earlier in the same day. FINDINGS: Cardiovascular: Thoracic aorta and its branches are within normal limits. No aneurysmal dilatation or dissection is seen. Pulmonary artery shows a normal branching pattern. No filling defect to suggest pulmonary embolism is seen. No coronary calcifications are noted Mediastinum/Nodes: Thoracic inlet is unremarkable. 9 mm short axis lymph node is noted in the right paratracheal region. 14 mm short axis subcarinal node is seen. Right hilar lymph nodes are noted. The largest of these measures 11 mm in short axis. The esophagus as visualized is within normal limits. Lungs/Pleura: Left lung is well aerated. No focal infiltrate or sizable effusion is seen. There are 2 cavitary lesions identified within the right lower lobe. The more lateral of these measures 3.9 x 3.6 cm. Air-fluid level is noted within. Slightly more superior and medial there is a 3.9 x 3.3 cm cavitary lesion with air-fluid level within. No sizable effusion or other focal parenchymal abnormality is noted. Upper Abdomen: Visualized upper abdomen shows fatty infiltration of the liver. Musculoskeletal: Degenerative changes of the thoracic spine are noted. No acute abnormality noted. Review of the MIP images confirms the above findings. IMPRESSION: Cavitary lesions within the right lower lobe with reactive lymphadenopathy. These changes likely represent multifocal lung abscesses although the possibility of neoplasm cannot be totally excluded. Follow-up CT following appropriate therapy is recommended. Electronically Signed   By: MInez CatalinaM.D.   On: 04/08/2020 18:14   ECHOCARDIOGRAM  COMPLETE  Result Date: 04/09/2020    ECHOCARDIOGRAM REPORT   Patient Name:   SNYKEEM CITRODate of Exam: 04/09/2020 Medical Rec #:  [id number]  Height:       70.0 in Accession #:    [id number] Weight:       170.0 lb Date of Birth:  [birth year]  BSA:          1.948 m Patient Age:    47years    BP:           104/65 mmHg Patient Gender: M           HR:           83 bpm. Exam Location:  [facility] Procedure: 2D Echo, Color Doppler and Cardiac Doppler Indications:     R50.9 Fever  History:         Patient has no prior history of Echocardiogram examinations. Pt  tested positive for COVID-19 on 08/10/19.  Sonographer:     Charmayne Sheer RDCS (AE) Referring Phys:  [place] Diagnosing Phys: Kathlyn Sacramento MD IMPRESSIONS  1. Left ventricular ejection fraction, by estimation, is 55 to 60%. The left ventricle has normal function. The left ventricle has no regional wall motion abnormalities. Left ventricular diastolic parameters were normal.  2. Right ventricular systolic function is normal. The right ventricular size is normal. Tricuspid regurgitation signal is inadequate for assessing PA pressure.  3. The mitral valve is normal in structure. No evidence of mitral valve regurgitation. No evidence of mitral stenosis.  4. The aortic valve is normal in structure. Aortic valve regurgitation is not visualized. No aortic stenosis is present.  5. The inferior vena cava is normal in size with greater than 50% respiratory variability, suggesting right atrial pressure of 3 mmHg. Conclusion(s)/Recommendation(s): No evidence of valvular vegetations on this transthoracic echocardiogram. FINDINGS  Left Ventricle: Left ventricular ejection fraction, by estimation, is 55 to 60%. The left ventricle has normal function. The left ventricle has no regional wall motion abnormalities. The left ventricular internal cavity size was normal in size. There is  no left ventricular hypertrophy. Left ventricular diastolic parameters  were normal. Right Ventricle: The right ventricular size is normal. No increase in right ventricular wall thickness. Right ventricular systolic function is normal. Tricuspid regurgitation signal is inadequate for assessing PA pressure. Left Atrium: Left atrial size was normal in size. Right Atrium: Right atrial size was normal in size. Pericardium: There is no evidence of pericardial effusion. Mitral Valve: The mitral valve is normal in structure. No evidence of mitral valve regurgitation. No evidence of mitral valve stenosis. MV peak gradient, 5.1 mmHg. The mean mitral valve gradient is 3.0 mmHg. Tricuspid Valve: The tricuspid valve is normal in structure. Tricuspid valve regurgitation is trivial. No evidence of tricuspid stenosis. Aortic Valve: The aortic valve is normal in structure. Aortic valve regurgitation is not visualized. No aortic stenosis is present. Aortic valve mean gradient measures 5.0 mmHg. Aortic valve peak gradient measures 10.5 mmHg. Aortic valve area, by VTI measures 2.42 cm. Pulmonic Valve: The pulmonic valve was normal in structure. Pulmonic valve regurgitation is not visualized. No evidence of pulmonic stenosis. Aorta: The aortic root is normal in size and structure. Venous: The inferior vena cava is normal in size with greater than 50% respiratory variability, suggesting right atrial pressure of 3 mmHg. IAS/Shunts: No atrial level shunt detected by color flow Doppler.  LEFT VENTRICLE PLAX 2D LVIDd:         4.66 cm  Diastology LVIDs:         3.43 cm  LV e' medial:    8.70 cm/s LV PW:         0.91 cm  LV E/e' medial:  10.7 LV IVS:        0.77 cm  LV e' lateral:   13.60 cm/s LVOT diam:     2.10 cm  LV E/e' lateral: 6.8 LV SV:         65 LV SV Index:   34 LVOT Area:     3.46 cm  RIGHT VENTRICLE RV Basal diam:  2.57 cm LEFT ATRIUM             Index       RIGHT ATRIUM           Index LA diam:        2.70 cm 1.39 cm/m  RA Area:     10.90  cm LA Vol (A2C):   33.4 ml 17.15 ml/m RA Volume:   21.60  ml  11.09 ml/m LA Vol (A4C):   21.3 ml 10.93 ml/m LA Biplane Vol: 28.6 ml 14.68 ml/m  AORTIC VALVE                    PULMONIC VALVE AV Area (Vmax):    2.52 cm     PV Vmax:       1.22 m/s AV Area (Vmean):   2.32 cm     PV Vmean:      81.900 cm/s AV Area (VTI):     2.42 cm     PV VTI:        0.237 m AV Vmax:           162.00 cm/s  PV Peak grad:  6.0 mmHg AV Vmean:          105.000 cm/s PV Mean grad:  3.0 mmHg AV VTI:            0.270 m AV Peak Grad:      10.5 mmHg AV Mean Grad:      5.0 mmHg LVOT Vmax:         118.00 cm/s LVOT Vmean:        70.400 cm/s LVOT VTI:          0.189 m LVOT/AV VTI ratio: 0.70  AORTA Ao Root diam: 3.00 cm MITRAL VALVE MV Area (PHT): 5.54 cm    SHUNTS MV Peak grad:  5.1 mmHg    Systemic VTI:  0.19 m MV Mean grad:  3.0 mmHg    Systemic Diam: 2.10 cm MV Vmax:       1.13 m/s MV Vmean:      76.8 cm/s MV Decel Time: 137 msec MV E velocity: 93.00 cm/s MV A velocity: 74.60 cm/s MV E/A ratio:  1.25 Kathlyn Sacramento MD Electronically signed by Kathlyn Sacramento MD Signature Date/Time: 04/09/2020/12:37:37 PM    Final        ASSESSMENT/PLAN   Right lower lobe cavitary lesions - with poor response to empiric IV Zosyn  - associated with hilar/mediastinal lymphadenopathy - as per radiology cannot rule out malignancy  - possible inflammatory vascultitis related - may need biopsy - endobronchial/ transbronchial -fungitell -respiratory culture - preliminary mixed species -agree that odds favor aspiration as etiology  -noted additional microbiology pending - Gold Quant -will add ANA and ANCA -sputum cytology  -Infectious disease on case - appreciate input -Antimicrobials as per ID  -s/p bronchoscopy - BAL results still pending -augmentin 4 wks - pulmonary clinic 7d follow up       Thank you for allowing me to participate in the care of this patient.   Patient/Family are satisfied with care plan and all questions have been answered.   This document was prepared using Dragon voice  recognition software and may include unintentional dictation errors.     Ottie Glazier, M.D.  Division of [place]

## 2020-04-13 NOTE — Discharge Summary (Signed)
Physician Discharge Summary  Tanner Berg UJW:[id number] DOB: 06/25/1970 DOA: 04/08/2020  PCP: Tanner Jericho, NP  Admit date: 04/08/2020 Discharge date: 04/13/2020  Admitted From: Home  Discharge disposition: Home   Recommendations for Outpatient Follow-Up:   . Follow up with your primary care provider in one week.  . Check CBC, BMP, magnesium in the next visit . Follow-up with Dr. Steva Ready, infectious disease in 2 weeks. . Patient will need dental evaluation and possible extraction to prevent from recurrent pulmonary abscess.  Discharge Diagnosis:   Principal Problem:   Cavitary pneumonia Active Problems:   Hemoptysis   Tobacco abuse   History of COVID-19   Dehydration   Discharge Condition: Improved.  Diet recommendation: Regular.  Wound care: None.  Code status: Full.   History of Present Illness:   Tanner Berg a 49 y.o.malewith medical history significant for COVID-19 infection in February 2021, history of dental abscess, GERD, presented to the hospital with 4-day history of cough, hemoptysis, sore throat and night sweats. He said he aspirated at night about 2 weeks prior to admission after he drank milk and cookies shortly before bedtime. He was found to have right lower lobe cavitary lesions concerning for multifocallungabscess.He was treated with empiric IV antibiotics. ID and pulmonologist consulted and the patient was admitted to the hospital.  Hospital Course:   Following conditions were addressed during hospitalization as listed below,  Fever/right lower lobe cavitary pneumonia/multifocal abscess/hemoptysis:Patient received IV Zosyn during hospitalization which has been changed to oral Augmentin for 28 days on discharge.  Infectious disease and pulmonary followed the patient during hospitalization.  Patient underwent bronchoscopy with bronchoalveolar lavage on 04/12/2020 by pulmonary.Marland Kitchen    BAL with no organisms no fungus isolated in  2 days.   WBC was 10.2 and patient was afebrile.  AFB stains were negative.  C-ANCA p-ANCA was negative.  Vasculitic work-up was negative as well.  Communicated with infectious disease and pulmonary.  At this time plan is discharged home with outpatient follow-up and completion of 4 weeks of Augmentin.  Volume depletion: Improved.     Insomnia: Trazodone as needed for sleep.   Tobacco use disorder: Continue nicotine patch.  Patient was extensively counseled about it.  He is motivated to quit smoking.  Disposition.  At this time, patient is stable for disposition home.  Will need to follow-up with primary care physician and ID as outpatient.  Medical Consultants:    Pulmonary  Infectious disease  Procedures:     Bronchoscopy with bronchoalveolar lavage on 04/11/2020 Subjective:   Today, patient  feels better.  Denies any chest pain, hemoptysis, cough, fever or chills.  Discharge Exam:   Vitals:   04/12/20 2221 04/13/20 0743  BP: 128/82 125/66  Pulse: 82 66  Resp: 17 18  Temp: 98.5 F (36.9 C) 98 F (36.7 C)  SpO2: 98% 98%   Vitals:   04/12/20 1243 04/12/20 1715 04/12/20 2221 04/13/20 0743  BP: 125/75 108/69 128/82 125/66  Pulse: 77 74 82 66  Resp: _0 Temp: 98 F (36.7 C) 98.4 F (36.9 C) 98.5 F (36.9 C) 98 F (36.7 C)  TempSrc: Oral Oral Oral Oral  SpO2: 100% 100% 98% 98%  Weight:      Height:       General: Alert awake, not in obvious distress HENT: pupils equally reacting to light,  No scleral pallor or icterus noted. Oral mucosa is moist.  Poor dentition. Chest: Diminished breath sounds bilaterally.  No wheezes noted  CVS: S1 &S2 heard. No murmur.  Regular rate and rhythm. Abdomen: Soft, nontender, nondistended.  Bowel sounds are heard.   Extremities: No cyanosis, clubbing or edema.  Peripheral pulses are palpable. Psych: Alert, awake and oriented, normal mood CNS:  No cranial nerve deficits.  Power equal in all extremities.   Skin: Warm  and dry.  No rashes noted.  The results of significant diagnostics from this hospitalization (including imaging, microbiology, ancillary and laboratory) are listed below for reference.     Diagnostic Studies:   ECHOCARDIOGRAM COMPLETE  Result Date: 04/09/2020    ECHOCARDIOGRAM REPORT   Patient Name:   Tanner Berg Date of Exam: 04/09/2020 Medical Rec #:  [id number]   Height:       70.0 in Accession #:    [id number]  Weight:       170.0 lb Date of Birth:  [birth year]   BSA:          1.948 m Patient Age:    49 years    BP:           104/65 mmHg Patient Gender: M           HR:           83 bpm. Exam Location:  [facility] Procedure: 2D Echo, Color Doppler and Cardiac Doppler Indications:     R50.9 Fever  History:         Patient has no prior history of Echocardiogram examinations. Pt                  tested positive for COVID-19 on 08/10/19.  Sonographer:     Charmayne Sheer RDCS (AE) Referring Phys:  [place] Diagnosing Phys: Kathlyn Sacramento MD IMPRESSIONS  1. Left ventricular ejection fraction, by estimation, is 55 to 60%. The left ventricle has normal function. The left ventricle has no regional wall motion abnormalities. Left ventricular diastolic parameters were normal.  2. Right ventricular systolic function is normal. The right ventricular size is normal. Tricuspid regurgitation signal is inadequate for assessing PA pressure.  3. The mitral valve is normal in structure. No evidence of mitral valve regurgitation. No evidence of mitral stenosis.  4. The aortic valve is normal in structure. Aortic valve regurgitation is not visualized. No aortic stenosis is present.  5. The inferior vena cava is normal in size with greater than 50% respiratory variability, suggesting right atrial pressure of 3 mmHg. Conclusion(s)/Recommendation(s): No evidence of valvular vegetations on this transthoracic echocardiogram. FINDINGS  Left Ventricle: Left ventricular ejection fraction, by estimation, is 55 to 60%. The left  ventricle has normal function. The left ventricle has no regional wall motion abnormalities. The left ventricular internal cavity size was normal in size. There is  no left ventricular hypertrophy. Left ventricular diastolic parameters were normal. Right Ventricle: The right ventricular size is normal. No increase in right ventricular wall thickness. Right ventricular systolic function is normal. Tricuspid regurgitation signal is inadequate for assessing PA pressure. Left Atrium: Left atrial size was normal in size. Right Atrium: Right atrial size was normal in size. Pericardium: There is no evidence of pericardial effusion. Mitral Valve: The mitral valve is normal in structure. No evidence of mitral valve regurgitation. No evidence of mitral valve stenosis. MV peak gradient, 5.1 mmHg. The mean mitral valve gradient is 3.0 mmHg. Tricuspid Valve: The tricuspid valve is normal in structure. Tricuspid valve regurgitation is trivial. No evidence of tricuspid stenosis. Aortic Valve: The aortic valve is normal in structure. Aortic valve regurgitation  is not visualized. No aortic stenosis is present. Aortic valve mean gradient measures 5.0 mmHg. Aortic valve peak gradient measures 10.5 mmHg. Aortic valve area, by VTI measures 2.42 cm. Pulmonic Valve: The pulmonic valve was normal in structure. Pulmonic valve regurgitation is not visualized. No evidence of pulmonic stenosis. Aorta: The aortic root is normal in size and structure. Venous: The inferior vena cava is normal in size with greater than 50% respiratory variability, suggesting right atrial pressure of 3 mmHg. IAS/Shunts: No atrial level shunt detected by color flow Doppler.  LEFT VENTRICLE PLAX 2D LVIDd:         4.66 cm  Diastology LVIDs:         3.43 cm  LV e' medial:    8.70 cm/s LV PW:         0.91 cm  LV E/e' medial:  10.7 LV IVS:        0.77 cm  LV e' lateral:   13.60 cm/s LVOT diam:     2.10 cm  LV E/e' lateral: 6.8 LV SV:         65 LV SV Index:   34 LVOT  Area:     3.46 cm  RIGHT VENTRICLE RV Basal diam:  2.57 cm LEFT ATRIUM             Index       RIGHT ATRIUM           Index LA diam:        2.70 cm 1.39 cm/m  RA Area:     10.90 cm LA Vol (A2C):   33.4 ml 17.15 ml/m RA Volume:   21.60 ml  11.09 ml/m LA Vol (A4C):   21.3 ml 10.93 ml/m LA Biplane Vol: 28.6 ml 14.68 ml/m  AORTIC VALVE                    PULMONIC VALVE AV Area (Vmax):    2.52 cm     PV Vmax:       1.22 m/s AV Area (Vmean):   2.32 cm     PV Vmean:      81.900 cm/s AV Area (VTI):     2.42 cm     PV VTI:        0.237 m AV Vmax:           162.00 cm/s  PV Peak grad:  6.0 mmHg AV Vmean:          105.000 cm/s PV Mean grad:  3.0 mmHg AV VTI:            0.270 m AV Peak Grad:      10.5 mmHg AV Mean Grad:      5.0 mmHg LVOT Vmax:         118.00 cm/s LVOT Vmean:        70.400 cm/s LVOT VTI:          0.189 m LVOT/AV VTI ratio: 0.70  AORTA Ao Root diam: 3.00 cm MITRAL VALVE MV Area (PHT): 5.54 cm    SHUNTS MV Peak grad:  5.1 mmHg    Systemic VTI:  0.19 m MV Mean grad:  3.0 mmHg    Systemic Diam: 2.10 cm MV Vmax:       1.13 m/s MV Vmean:      76.8 cm/s MV Decel Time: 137 msec MV E velocity: 93.00 cm/s MV A velocity: 74.60 cm/s MV E/A ratio:  1.25 Kathlyn Sacramento MD Electronically signed by  Kathlyn Sacramento MD Signature Date/Time: 04/09/2020/12:37:37 PM    Final      Labs:   Basic Metabolic Panel: Recent Labs  Lab 04/08/20 1319 04/08/20 2051 04/09/20 0527  NA 135  --   --   K 3.7  --   --   CL 99  --   --   CO2 24  --   --   GLUCOSE 121*  --   --   BUN 14  --   --   CREATININE 0.94  --  0.79  CALCIUM 8.5*  --   --   MG  --  2.1  --   PHOS  --   --  2.7   GFR Estimated Creatinine Clearance: 119 mL/min (by C-G formula based on SCr of 0.79 mg/dL). Liver Function Tests: Recent Labs  Lab 04/08/20 1924  AST 28  ALT 44  ALKPHOS 86  BILITOT 0.8  PROT 8.5*  ALBUMIN 3.7   No results for input(s): LIPASE, AMYLASE in the last 168 hours. No results for input(s): AMMONIA in the last 168  hours. Coagulation profile Recent Labs  Lab 04/08/20 2051  INR 1.1    CBC: Recent Labs  Lab 04/08/20 1319 04/13/20 0406  WBC 17.3*  18.2* 10.2  NEUTROABS 14.0* 6.0  HGB 14.1  14.5 12.0*  HCT 41.2  41.7 35.0*  MCV 89.8  87.6 88.8  PLT 419*  381 552*   Cardiac Enzymes: Recent Labs  Lab 04/08/20 2051  CKTOTAL 101   BNP: Invalid input(s): POCBNP CBG: No results for input(s): GLUCAP in the last 168 hours. D-Dimer No results for input(s): DDIMER in the last 72 hours. Hgb A1c No results for input(s): HGBA1C in the last 72 hours. Lipid Profile No results for input(s): CHOL, HDL, LDLCALC, TRIG, CHOLHDL, LDLDIRECT in the last 72 hours. Thyroid function studies No results for input(s): TSH, T4TOTAL, T3FREE, THYROIDAB in the last 72 hours.  Invalid input(s): FREET3 Anemia work up No results for input(s): VITAMINB12, FOLATE, FERRITIN, TIBC, IRON, RETICCTPCT in the last 72 hours. Microbiology Recent Results (from the past 240 hour(s))  Acid Fast Smear (AFB)     Status: None   Collection Time: 04/08/20  3:14 AM   Specimen: Sputum  Result Value Ref Range Status   AFB Specimen Processing Concentration  Final   Acid Fast Smear Negative  Final    Comment: (NOTE) Performed At: [facility] [place], [place] [id number] Rush Farmer MD [id number]    Source (AFB) SPUTUM  Final    Comment: Performed at [facility], [address] [place], [place] [address]  Respiratory Panel by RT PCR (Flu A&B, Covid) - Nasopharyngeal Swab     Status: None   Collection Time: 04/08/20  7:23 PM   Specimen: Nasopharyngeal Swab  Result Value Ref Range Status   SARS Coronavirus 2 by RT PCR NEGATIVE NEGATIVE Final    Comment: (NOTE) SARS-CoV-2 target nucleic acids are NOT DETECTED.  The SARS-CoV-2 RNA is generally detectable in upper respiratoy specimens during the acute phase of infection. The lowest concentration of SARS-CoV-2 viral copies this assay  can detect is 131 copies/mL. A negative result does not preclude SARS-Cov-2 infection and should not be used as the sole basis for treatment or other patient management decisions. A negative result may occur with  improper specimen collection/handling, submission of specimen other than nasopharyngeal swab, presence of viral mutation(s) within the areas targeted by this assay, and inadequate number of viral copies (<  131 copies/mL). A negative result must be combined with clinical observations, patient history, and epidemiological information. The expected result is Negative.  Fact Sheet for Patients:  PinkCheek.be  Fact Sheet for Healthcare Providers:  GravelBags.it  This test is no t yet approved or cleared by the [place] FDA and  has been authorized for detection and/or diagnosis of SARS-CoV-2 by FDA under an Emergency Use Authorization (EUA). This EUA will remain  in effect (meaning this test can be used) for the duration of the COVID-19 declaration under Section 564(b)(1) of the Act, 21 U.S.C. section 360bbb-3(b)(1), unless the authorization is terminated or revoked sooner.     Influenza A by PCR NEGATIVE NEGATIVE Final   Influenza B by PCR NEGATIVE NEGATIVE Final    Comment: (NOTE) The Xpert Xpress SARS-CoV-2/FLU/RSV assay is intended as an aid in  the diagnosis of influenza from Nasopharyngeal swab specimens and  should not be used as a sole basis for treatment. Nasal washings and  aspirates are unacceptable for Xpert Xpress SARS-CoV-2/FLU/RSV  testing.  Fact Sheet for Patients: PinkCheek.be  Fact Sheet for Healthcare Providers: GravelBags.it  This test is not yet approved or cleared by the [place] FDA and  has been authorized for detection and/or diagnosis of SARS-CoV-2 by  FDA under an Emergency Use Authorization (EUA). This EUA will remain    in effect (meaning this test can be used) for the duration of the  Covid-19 declaration under Section 564(b)(1) of the Act, 21  U.S.C. section 360bbb-3(b)(1), unless the authorization is  terminated or revoked. Performed at [facility], [address] [place], [place] [address]   Blood culture (routine x 2)     Status: None   Collection Time: 04/08/20  8:51 PM   Specimen: BLOOD  Result Value Ref Range Status   Specimen Description BLOOD RIGHT [facility]  Final   Special Requests   Final    BOTTLES DRAWN AEROBIC AND ANAEROBIC Blood Culture adequate volume   Culture   Final    NO GROWTH 5 DAYS Performed at [facility], [address] [place], [place] [address]    Report Status 04/13/2020 FINAL  Final  Blood culture (routine x 2)     Status: None   Collection Time: 04/08/20  8:51 PM   Specimen: BLOOD LEFT HAND  Result Value Ref Range Status   Specimen Description BLOOD LEFT HAND  Final   Special Requests   Final    BOTTLES DRAWN AEROBIC AND ANAEROBIC Blood Culture adequate volume   Culture   Final    NO GROWTH 5 DAYS Performed at [facility], [address] [place], [place] [address]    Report Status 04/13/2020 FINAL  Final  MRSA PCR Screening     Status: None   Collection Time: 04/08/20 10:07 PM   Specimen: Nasal Mucosa; Nasopharyngeal  Result Value Ref Range Status   MRSA by PCR NEGATIVE NEGATIVE Final    Comment:        The GeneXpert MRSA Assay (FDA approved for NASAL specimens only), is one component of a comprehensive MRSA colonization surveillance program. It is not intended to diagnose MRSA infection nor to guide or monitor treatment for MRSA infections. Performed at [facility], [address] [place], [place] [address]   Expectorated sputum assessment w rflx to resp cult     Status: None   Collection Time: 04/09/20  3:14 AM   Specimen: Sputum  Result Value Ref Range Status   Specimen  Description EXPECTORATED SPUTUM   Final   Special Requests Immunocompromised  Final   Sputum evaluation   Final    THIS SPECIMEN IS ACCEPTABLE FOR SPUTUM CULTURE Performed at [facility], [address] [place], [place] [address]    Report Status 04/09/2020 FINAL  Final  Culture, respiratory     Status: None   Collection Time: 04/09/20  3:14 AM  Result Value Ref Range Status   Specimen Description   Final    EXPECTORATED SPUTUM Performed at [facility], [address] [place], [place] [address]    Special Requests   Final    Immunocompromised Reflexed from [id number] Performed at [facility], [address] [place], [place] [address]    Gram Stain   Final    FEW SQUAMOUS EPITHELIAL CELLS PRESENT MODERATE WBC PRESENT,BOTH PMN AND MONONUCLEAR FEW GRAM POSITIVE COCCI FEW GRAM NEGATIVE RODS    Culture   Final    RARE Normal respiratory flora-no Staph aureus or Pseudomonas seen Performed at [facility] [address], [place], [place] [address]    Report Status 04/11/2020 FINAL  Final  Culture, bal-quantitative     Status: None (Preliminary result)   Collection Time: 04/11/20  9:04 AM   Specimen: Bronchoalveolar Lavage; Respiratory  Result Value Ref Range Status   Specimen Description   Final    BRONCHIAL ALVEOLAR LAVAGE Performed at [facility], [address] [place], [place] [address]    Special Requests   Final    NONE Performed at [facility], [address] [place], [place] [address]    Gram Stain   Final    MODERATE WBC PRESENT, PREDOMINANTLY PMN NO ORGANISMS SEEN    Culture   Final    NO GROWTH 2 DAYS Performed at [facility], [place] [address], [place], [place] [address]    Report Status PENDING  Incomplete  Culture, fungus without smear     Status: None (Preliminary result)   Collection Time: 04/11/20  9:04 AM   Specimen: Bronchoalveolar Lavage; Other  Result Value Ref Range Status   Specimen Description   Final     BRONCHIAL ALVEOLAR LAVAGE Performed at [facility], [address] [place], [place] [address]    Special Requests   Final    NONE Performed at [facility], [address] [place], [place] [address]    Culture   Final    NO FUNGUS ISOLATED AFTER 2 DAYS Performed at [facility], [place] [address], [place], [place] [address]    Report Status PENDING  Incomplete  Acid Fast Smear (AFB)     Status: None   Collection Time: 04/11/20  9:04 AM   Specimen: Bronchoalveolar Lavage; Lung  Result Value Ref Range Status   AFB Specimen Processing Concentration  Final   Acid Fast Smear Negative  Final    Comment: (NOTE) Performed At: [facility] [address] [place], [place] [id number] Rush Farmer MD [id number]    Source (AFB) BRONCHIAL ALVEOLAR LAVAGE  Final    Comment: Performed at [facility], [address] [place], [place] [address]  KOH prep     Status: None   Collection Time: 04/11/20  9:04 AM   Specimen: Bronchoalveolar Lavage  Result Value Ref Range Status   Specimen Description BRONCHIAL ALVEOLAR LAVAGE  Final   Special Requests NONE  Final   KOH Prep   Final    NO YEAST OR  FUNGAL ELEMENTS SEEN Performed at [facility], [address] [place], [place] [address]    Report Status 04/11/2020 FINAL  Final  Anaerobic culture     Status: None (Preliminary result)   Collection Time: 04/11/20  9:04 AM   Specimen: Bronchoalveolar Lavage; Lung  Result Value Ref Range Status   Specimen Description   Final    BRONCHIAL ALVEOLAR LAVAGE Performed at [facility], [address], [place], [place] [address]    Special Requests   Final    NONE Performed at [facility], [address] [place], [place] [address]    Culture   Final    NO ANAEROBES ISOLATED; CULTURE IN PROGRESS FOR 5 DAYS   Report Status PENDING  Incomplete     Discharge Instructions:   Discharge Instructions    Diet general    Complete by: As directed    Discharge instructions   Complete by: As directed    Follow up with your primary care provider in 1-2 weeks for routine checkup.  Continue antibiotics as prescribed. Follow up with infectious disease after discharge. Please have dental check up and extraction as needed. Take over the counter probiotic everyday. Ok to take tylenol/motrin for mild pain. No smoking please.   Increase activity slowly   Complete by: As directed      Allergies as of 04/13/2020   No Known Allergies     Medication List    TAKE these medications   amoxicillin-clavulanate 875-125 MG tablet Commonly known as: Augmentin Take 1 tablet by mouth 2 (two) times daily for 28 days.   ibuprofen 200 MG tablet Commonly known as: ADVIL Take 600 mg by mouth every 6 (six) hours as needed.   nicotine 21 mg/24hr patch Commonly known as: NICODERM CQ - dosed in mg/24 hours Place 1 patch (21 mg total) onto the skin daily. Start taking on: April 14, 2020   traZODone 50 MG tablet Commonly known as: DESYREL Take 50 mg by mouth at bedtime.       Follow-up Information    Tanner Jericho, NP. Schedule an appointment as soon as possible for a visit on 04/20/2020.   Specialty: Family Medicine Why: regular followup.... 2pm  Contact information: [place] [address] [phone]        Tsosie Billing, MD. Schedule an appointment as soon as possible for a visit in 2 weeks.   Specialty: Infectious Diseases Why: for lung abscess followup.... Patient will need to call and make a follow up appointment. Contact information: [place]  [address] [phone]                Time coordinating discharge: 39 minutes  Signed:  Cheila Wickstrom  [facility] 04/13/2020, 12:21 PM

## 2020-04-14 LAB — CULTURE, BAL-QUANTITATIVE W GRAM STAIN: Culture: NO GROWTH

## 2020-04-16 LAB — ANAEROBIC CULTURE

## 2020-05-03 LAB — CULTURE, FUNGUS WITHOUT SMEAR

## 2020-05-24 LAB — ACID FAST CULTURE WITH REFLEXED SENSITIVITIES (MYCOBACTERIA): Acid Fast Culture: NEGATIVE

## 2020-05-26 LAB — ACID FAST CULTURE WITH REFLEXED SENSITIVITIES (MYCOBACTERIA): Acid Fast Culture: NEGATIVE
